# Patient Record
Sex: Male | Born: 1985 | Race: White | Hispanic: No | Marital: Married | Smoking: Never smoker
Health system: Southern US, Community
[De-identification: ages and names within clinical notes are randomized; demographics above are authoritative.]

## PROBLEM LIST (undated history)

## (undated) DIAGNOSIS — F988 Other specified behavioral and emotional disorders with onset usually occurring in childhood and adolescence: Secondary | ICD-10-CM

## (undated) DIAGNOSIS — L649 Androgenic alopecia, unspecified: Secondary | ICD-10-CM

## (undated) DIAGNOSIS — E66811 Obesity, class 1: Secondary | ICD-10-CM

## (undated) DIAGNOSIS — K760 Fatty (change of) liver, not elsewhere classified: Secondary | ICD-10-CM

## (undated) DIAGNOSIS — E669 Obesity, unspecified: Secondary | ICD-10-CM

## (undated) DIAGNOSIS — K219 Gastro-esophageal reflux disease without esophagitis: Secondary | ICD-10-CM

## (undated) DIAGNOSIS — Z9109 Other allergy status, other than to drugs and biological substances: Secondary | ICD-10-CM

## (undated) HISTORY — DX: Other allergy status, other than to drugs and biological substances: Z91.09

## (undated) HISTORY — DX: Fatty (change of) liver, not elsewhere classified: K76.0

## (undated) HISTORY — PX: OTHER SURGICAL HISTORY: SHX169

## (undated) HISTORY — DX: Other specified behavioral and emotional disorders with onset usually occurring in childhood and adolescence: F98.8

## (undated) HISTORY — DX: Obesity, unspecified: E66.9

## (undated) HISTORY — DX: Obesity, class 1: E66.811

## (undated) HISTORY — DX: Androgenic alopecia, unspecified: L64.9

## (undated) HISTORY — DX: Gastro-esophageal reflux disease without esophagitis: K21.9

## (undated) HISTORY — PX: VASECTOMY: SHX75

---

## 2006-12-17 ENCOUNTER — Emergency Department (HOSPITAL_COMMUNITY): Admission: EM | Admit: 2006-12-17 | Discharge: 2006-12-17 | Payer: Self-pay | Admitting: Emergency Medicine

## 2010-05-20 DIAGNOSIS — K760 Fatty (change of) liver, not elsewhere classified: Secondary | ICD-10-CM

## 2010-05-20 HISTORY — DX: Fatty (change of) liver, not elsewhere classified: K76.0

## 2010-10-01 ENCOUNTER — Ambulatory Visit: Payer: Self-pay | Admitting: Family

## 2010-10-02 ENCOUNTER — Encounter: Payer: Self-pay | Admitting: Family

## 2010-10-02 ENCOUNTER — Ambulatory Visit (INDEPENDENT_AMBULATORY_CARE_PROVIDER_SITE_OTHER): Payer: BC Managed Care – PPO | Admitting: Family

## 2010-10-02 VITALS — BP 112/66 | HR 66 | Temp 97.8°F | Resp 18 | Ht 73.5 in | Wt 236.1 lb

## 2010-10-02 DIAGNOSIS — Z0000 Encounter for general adult medical examination without abnormal findings: Secondary | ICD-10-CM

## 2010-10-02 DIAGNOSIS — J45909 Unspecified asthma, uncomplicated: Secondary | ICD-10-CM

## 2010-10-02 DIAGNOSIS — Z23 Encounter for immunization: Secondary | ICD-10-CM

## 2010-10-02 DIAGNOSIS — K219 Gastro-esophageal reflux disease without esophagitis: Secondary | ICD-10-CM

## 2010-10-02 DIAGNOSIS — J309 Allergic rhinitis, unspecified: Secondary | ICD-10-CM

## 2010-10-02 DIAGNOSIS — Z13228 Encounter for screening for other metabolic disorders: Secondary | ICD-10-CM

## 2010-10-02 NOTE — Progress Notes (Signed)
Subjective:    Patient ID: Gary Griffith, male    DOB: 1985/07/28, 25 y.o.   MRN: [id number]  HPI  [name] is a 25 yr old male who presents today to establish care.  Preventative-  Patient stopped exercising 2 months ago due to back injury. Back is improved- he has been following with a chiropractor.   Diet is fair- he has cut out french fries.  Drinks 1 soda day.  Last tetanus shot was around 2000.    Asthma- as a child, no current issues.  Hay Fever- saw an allergist last year- told that he has tree, grass, dust, horse.  He takes OTC claritin with good improvement.   GERD-  Takes otc prilosec once a day with good improvement.    Review of Systems  Constitutional: Negative for fever and unexpected weight change.  HENT: Negative for hearing loss.   Eyes: Negative for visual disturbance.  Respiratory: Negative for chest tightness and wheezing.   Cardiovascular: Negative for chest pain and leg swelling.  Gastrointestinal: Negative for diarrhea and constipation.  Genitourinary: Negative for dysuria and frequency.  Musculoskeletal: Positive for back pain. Negative for arthralgias.  Skin: Negative.   Neurological: Negative for weakness and numbness.  Hematological: Negative for adenopathy.  Psychiatric/Behavioral:       Denies hx of depression or anxiety   Past Medical History  Diagnosis Date  . Allergy     environmental--trees, grass, dust  . Asthma   . History of chicken pox   . GERD (gastroesophageal reflux disease)     History   Social History  . Marital Status: Married    Spouse Name: N/A    Number of Children: 0  . Years of Education: N/A   Occupational History  . Not on file.   Social History Main Topics  . Smoking status: Never Smoker   . Smokeless tobacco: Never Used  . Alcohol Use: 0.6 oz/week    1 Cans of beer per week  . Drug Use: No  . Sexually Active: Not on file   Other Topics Concern  . Not on file   Social History Narrative   Regular  exercise: not currently, plans to restart gym 3 x weeklyCaffeine Use: 1 soda daily.    Past Surgical History  Procedure Date  . No surgery     Family History  Problem Relation Age of Onset  . Hypertension Father   . Diabetes Maternal Grandmother   . Heart disease Maternal Grandmother     MI, stents  . Diabetes Paternal Grandfather   . Cancer Neg Hx     No Known Allergies  No current outpatient prescriptions on file prior to visit.    BP 112/66  Pulse 66  Temp(Src) 97.8 F (36.6 C) (Oral)  Resp 18  Ht 6' 1.5" (1.867 m)  Wt 236 lb 1.3 oz (107.085 kg)  BMI 30.72 kg/m2        Objective:   Physical Exam  Constitutional: He is oriented to person, place, and time. He appears well-developed and well-nourished.  HENT:  Head: Normocephalic and atraumatic.  Right Ear: Tympanic membrane and ear canal normal.  Left Ear: Tympanic membrane and ear canal normal.  Mouth/Throat: Uvula is midline, oropharynx is clear and moist and mucous membranes are normal.  Eyes: Conjunctivae and EOM are normal. Pupils are equal, round, and reactive to light.  Neck: Normal range of motion. Neck supple. No JVD present. No thyromegaly present.  Cardiovascular: Normal rate and  regular rhythm.   Pulmonary/Chest: Effort normal and breath sounds normal.  Abdominal: Soft. Bowel sounds are normal. He exhibits no distension. There is no tenderness.  Musculoskeletal: Normal range of motion. He exhibits no edema and no tenderness.  Lymphadenopathy:    He has no cervical adenopathy.  Neurological: He is alert and oriented to person, place, and time.  Skin: Skin is warm and dry. No rash noted. No erythema.  Psychiatric: He has a normal mood and affect. His behavior is normal. Judgment and thought content normal.          Assessment & Plan:

## 2010-10-02 NOTE — Patient Instructions (Addendum)
Please complete your cystic fibrosis screening lab work on the first floor.  Return to the lab fasting for your physical lab work.

## 2010-10-03 DIAGNOSIS — J45909 Unspecified asthma, uncomplicated: Secondary | ICD-10-CM | POA: Insufficient documentation

## 2010-10-03 DIAGNOSIS — K219 Gastro-esophageal reflux disease without esophagitis: Secondary | ICD-10-CM | POA: Insufficient documentation

## 2010-10-03 DIAGNOSIS — J309 Allergic rhinitis, unspecified: Secondary | ICD-10-CM | POA: Insufficient documentation

## 2010-10-03 LAB — CBC WITH DIFFERENTIAL/PLATELET
Basophils Absolute: 0 10*3/uL (ref 0.0–0.1)
Basophils Relative: 0 % (ref 0–1)
Eosinophils Absolute: 0.2 10*3/uL (ref 0.0–0.7)
Hemoglobin: 17 g/dL (ref 13.0–17.0)
MCH: 29.7 pg (ref 26.0–34.0)
MCHC: 36.1 g/dL — ABNORMAL HIGH (ref 30.0–36.0)
Monocytes Absolute: 0.4 10*3/uL (ref 0.1–1.0)
Monocytes Relative: 7 % (ref 3–12)
Neutrophils Relative %: 60 % (ref 43–77)
RDW: 13 % (ref 11.5–15.5)

## 2010-10-03 LAB — HEPATIC FUNCTION PANEL
ALT: 79 U/L — ABNORMAL HIGH (ref 0–53)
Bilirubin, Direct: 0.1 mg/dL (ref 0.0–0.3)
Indirect Bilirubin: 0.4 mg/dL (ref 0.0–0.9)
Total Bilirubin: 0.5 mg/dL (ref 0.3–1.2)

## 2010-10-03 LAB — LIPID PANEL
Cholesterol: 166 mg/dL (ref 0–200)
HDL: 35 mg/dL — ABNORMAL LOW (ref >39)
Triglycerides: 161 mg/dL — ABNORMAL HIGH (ref <150)
VLDL: 32 mg/dL (ref 0–40)

## 2010-10-03 NOTE — Assessment & Plan Note (Signed)
Pt counseled on diet, exercise and weight loss.  Due for tetanus which was given today.

## 2010-10-03 NOTE — Assessment & Plan Note (Signed)
Wife has cystic fibrosis trait.  He wishes to be tested as part of pre-conception planning.  Will order.

## 2010-10-03 NOTE — Assessment & Plan Note (Signed)
Stable, continue loratidine.

## 2010-10-03 NOTE — Assessment & Plan Note (Signed)
Stable, continue PPI

## 2010-10-04 LAB — BASIC METABOLIC PANEL WITH GFR
BUN: 16 mg/dL (ref 6–23)
Calcium: 10.5 mg/dL (ref 8.4–10.5)
GFR, Est African American: >60 mL/min (ref >60)
Glucose, Bld: 93 mg/dL (ref 70–99)
Sodium: 141 mEq/L (ref 135–145)

## 2010-10-08 ENCOUNTER — Telehealth: Payer: Self-pay | Admitting: *Deleted

## 2010-10-08 DIAGNOSIS — R748 Abnormal levels of other serum enzymes: Secondary | ICD-10-CM

## 2010-10-08 NOTE — Telephone Encounter (Signed)
Received message from pt requesting status of Cystic Fibrosis screen. Per Annice Pih at [facility], test has a 5 day turn around time. Today is the 5th day, we should receive result by tomorrow. Pt has been notified of status. Will call lab tomorrow if result is not final.

## 2010-10-09 LAB — CYSTIC FIBROSIS DIAGNOSTIC STUDY

## 2010-10-09 NOTE — Telephone Encounter (Signed)
Spoke to [place] at [facility], he states he will email the Outside lab to check on the status of Cystic Fibrosis Screen as it is still pending in their system.

## 2010-10-10 NOTE — Telephone Encounter (Signed)
Pt notified, see result note. Orders entered for future labs and forwarded to the lab. Cystic Fibrosis result faxed to [facility] @ [phone].  All results mailed to pt.

## 2010-10-12 ENCOUNTER — Telehealth: Payer: Self-pay | Admitting: *Deleted

## 2010-10-12 NOTE — Telephone Encounter (Signed)
Received records from Dr Jonny Ruiz Mitchell's office and forwarded to Provider for review.

## 2010-10-22 ENCOUNTER — Encounter: Payer: Self-pay | Admitting: Family

## 2010-10-22 DIAGNOSIS — F988 Other specified behavioral and emotional disorders with onset usually occurring in childhood and adolescence: Secondary | ICD-10-CM | POA: Insufficient documentation

## 2010-10-22 HISTORY — DX: Other specified behavioral and emotional disorders with onset usually occurring in childhood and adolescence: F98.8

## 2010-10-30 LAB — HEPATITIS PANEL, ACUTE: Hepatitis B Surface Ag: NEGATIVE

## 2010-10-30 LAB — HEPATIC FUNCTION PANEL
ALT: 103 U/L — ABNORMAL HIGH (ref 0–53)
AST: 44 U/L — ABNORMAL HIGH (ref 0–37)
Albumin: 4.8 g/dL (ref 3.5–5.2)

## 2010-10-31 ENCOUNTER — Telehealth: Payer: Self-pay | Admitting: Family

## 2010-10-31 DIAGNOSIS — R7989 Other specified abnormal findings of blood chemistry: Secondary | ICD-10-CM

## 2010-10-31 NOTE — Telephone Encounter (Signed)
Call placed to patient at [phone], he was advised per Sandford Craze instructions. He was advised to call back if he has not heard from the office in 3-5 days regarding his ultrasound. Patient has verbalized understanding and agrees as instructed

## 2010-10-31 NOTE — Telephone Encounter (Signed)
Please call patient and let him know that his liver tests remain elevated.  His hepatitis screening is negative.   I would like for him to complete an ultrasound of his liver.

## 2010-11-02 ENCOUNTER — Ambulatory Visit (HOSPITAL_BASED_OUTPATIENT_CLINIC_OR_DEPARTMENT_OTHER)
Admission: RE | Admit: 2010-11-02 | Discharge: 2010-11-02 | Disposition: A | Discharge status: Home / Self Care | Payer: BC Managed Care – PPO | Source: Ambulatory Visit | Attending: Family | Admitting: Family

## 2010-11-02 DIAGNOSIS — R945 Abnormal results of liver function studies: Secondary | ICD-10-CM | POA: Insufficient documentation

## 2010-11-02 DIAGNOSIS — R7989 Other specified abnormal findings of blood chemistry: Secondary | ICD-10-CM

## 2010-11-04 ENCOUNTER — Telehealth: Payer: Self-pay | Admitting: Family

## 2010-11-04 DIAGNOSIS — K760 Fatty (change of) liver, not elsewhere classified: Secondary | ICD-10-CM

## 2010-11-04 DIAGNOSIS — R7989 Other specified abnormal findings of blood chemistry: Secondary | ICD-10-CM

## 2010-11-04 NOTE — Telephone Encounter (Signed)
Pls call patient and let him know that his ultrasound shows a fatty liver.  I recommend that he work hard on a low fat/low cholesterol diet, exercise and weight loss.

## 2010-11-05 ENCOUNTER — Telehealth: Payer: Self-pay | Admitting: Family

## 2010-11-05 NOTE — Telephone Encounter (Signed)
Pt would like results from ultra sound.

## 2010-11-05 NOTE — Telephone Encounter (Signed)
Pt.notified

## 2010-11-06 ENCOUNTER — Telehealth: Payer: Self-pay | Admitting: *Deleted

## 2010-11-06 NOTE — Telephone Encounter (Signed)
Received message from pt requesting a referral to a nutritionist as we recommended that he follow a low fat/ low cholesterol diet. Please advise.

## 2010-11-07 ENCOUNTER — Telehealth: Payer: Self-pay | Admitting: Family

## 2010-11-07 DIAGNOSIS — K760 Fatty (change of) liver, not elsewhere classified: Secondary | ICD-10-CM

## 2010-11-07 NOTE — Telephone Encounter (Signed)
Pt called back requesting status of nutritionist recommendation and wants to know if he still needs to keep appt with [place] on 11/09/10 as we have already notified him of labs and u/s. Please advise.

## 2010-11-07 NOTE — Telephone Encounter (Signed)
I have placed nutrition referral.  He can reschedule his follow up for 3 months.

## 2010-11-08 NOTE — Telephone Encounter (Signed)
Left detailed message on machine re: instructions below and to call and reschedule follow up for the end of September.

## 2010-11-09 ENCOUNTER — Ambulatory Visit: Payer: BC Managed Care – PPO | Admitting: Family

## 2010-12-19 ENCOUNTER — Encounter: Payer: Self-pay | Admitting: Family Medicine

## 2010-12-19 ENCOUNTER — Ambulatory Visit (INDEPENDENT_AMBULATORY_CARE_PROVIDER_SITE_OTHER): Payer: BC Managed Care – PPO | Admitting: Family Medicine

## 2010-12-19 VITALS — BP 145/83 | HR 62 | Temp 98.0°F | Ht 73.5 in | Wt 239.4 lb

## 2010-12-19 DIAGNOSIS — H6092 Unspecified otitis externa, left ear: Secondary | ICD-10-CM

## 2010-12-19 DIAGNOSIS — H60399 Other infective otitis externa, unspecified ear: Secondary | ICD-10-CM

## 2010-12-19 DIAGNOSIS — H6090 Unspecified otitis externa, unspecified ear: Secondary | ICD-10-CM

## 2010-12-19 MED ORDER — CEFDINIR 300 MG PO CAPS: 300.0000 mg | ORAL_CAPSULE | Freq: Two times a day (BID) | ORAL | Status: AC

## 2010-12-19 MED ORDER — ALIGN PO CAPS: 1.0000 | ORAL_CAPSULE | Freq: Every day | ORAL | Status: AC

## 2010-12-19 NOTE — Assessment & Plan Note (Signed)
Patient is in today with 24 hour history of worsening left ear pain. He had a work physical yesterday where they noted a significant amount of wax in his ear popped and then overnight began to have pain in front of them behind the ear stiffness and some discomfort in the neck and some low-grade headache myalgias and fatigue and is here today for evaluation. He is on have an otitis externa on examination due to his systemic symptoms we will start him on Ceftin air 3 mg twice a day and he is asked to take a probiotic and a yogurt daily as well report if symptoms do not improve. If he begins to have recurrent trouble with swimmer's ear he may try swimmer's ear drops after getting out of school routinely

## 2010-12-19 NOTE — Progress Notes (Signed)
Gary Griffith [id number] Jun 20, 1985 12/19/2010      Progress Note-Follow Up  Subjective  Chief Complaint  Chief Complaint  Patient presents with  . Otitis Media    left ear X 1 day    HPI  Patient is a 25 year old Caucasian male who is in today with a 24-hour history of left ear pain. He actually had a work physical yesterday where they reported seeing a significant amount of wax in his ear then over the past 24 hours she's developed some pain around his ear. He's developed some muffled hearing as well. Some painful lymph nodes also developed as well as malaise, myalgias. No fevers, chills, cp, palp, sob. Has ongoing allergic symptoms and mild congestion but that is relatively well controlled on Loratadine.  Past Medical History  Diagnosis Date  . Allergy     environmental--trees, grass, dust  . Asthma   . History of chicken pox   . GERD (gastroesophageal reflux disease)   . ADD (attention deficit disorder) 10/22/2010  . Otitis externa of left ear 12/19/2010    Past Surgical History  Procedure Date  . No surgery     Family History  Problem Relation Age of Onset  . Hypertension Father   . Diabetes Maternal Grandmother   . Heart disease Maternal Grandmother     MI, stents  . Diabetes Paternal Grandfather   . Cancer Neg Hx     History   Social History  . Marital Status: Married    Spouse Name: N/A    Number of Children: 0  . Years of Education: N/A   Occupational History  . Not on file.   Social History Main Topics  . Smoking status: Never Smoker   . Smokeless tobacco: Never Used  . Alcohol Use: 0.6 oz/week    1 Cans of beer per week     occasionally  . Drug Use: No  . Sexually Active: Yes -- Male partner(s)   Other Topics Concern  . Not on file   Social History Narrative   Regular exercise: not currently, plans to restart gym 3 x weeklyCaffeine Use: 1 soda daily.    Current Outpatient Prescriptions on File Prior to Visit  Medication Sig Dispense  Refill  . loratadine (ALLERGY) 10 MG tablet Take 10 mg by mouth daily as needed.       Marland Kitchen omeprazole (PRILOSEC) 20 MG capsule Take 20 mg by mouth every morning.          No Known Allergies  Review of Systems  Review of Systems  Constitutional: Positive for malaise/fatigue. Negative for fever.  HENT: Positive for ear pain. Negative for hearing loss, nosebleeds, congestion, sore throat and tinnitus.        Left ear pain and mildly muffled hearing x 24 hours  Eyes: Negative for discharge.  Respiratory: Negative for shortness of breath.   Cardiovascular: Negative for chest pain, palpitations and leg swelling.  Gastrointestinal: Negative for nausea, abdominal pain and diarrhea.  Genitourinary: Negative for dysuria.  Musculoskeletal: Positive for myalgias. Negative for falls.  Skin: Negative for rash.  Neurological: Negative for loss of consciousness and headaches.  Endo/Heme/Allergies: Negative for polydipsia.  Psychiatric/Behavioral: Negative for depression and suicidal ideas. The patient is not nervous/anxious and does not have insomnia.     Objective  BP 145/83  Pulse 62  Temp(Src) 98 F (36.7 C) (Oral)  Ht 6' 1.5" (1.867 m)  Wt 239 lb 6.4 oz (108.591 kg)  BMI 31.16 kg/m2  SpO2 97%  Physical Exam  Physical Exam  Constitutional: He is oriented to person, place, and time and well-developed, well-nourished, and in no distress. No distress.  HENT:  Head: Normocephalic and atraumatic.  Right Ear: External ear normal.  Nose: Nose normal.       Left ear has posterior and anterior auricular lymphadenopathy mildly tender, external canal is boggy, erythematous and has serous drainage, TM is largely obscured by inflammation but is dull and erythematous  Eyes: Conjunctivae are normal. Right eye exhibits no discharge. Left eye exhibits no discharge.  Neck: Neck supple. No thyromegaly present.  Cardiovascular: Normal rate, regular rhythm and normal heart sounds.   No murmur  heard. Pulmonary/Chest: Effort normal and breath sounds normal. No respiratory distress.  Abdominal: He exhibits no distension and no mass. There is no tenderness.  Musculoskeletal: He exhibits no edema.  Neurological: He is alert and oriented to person, place, and time.  Skin: Skin is warm.  Psychiatric: Memory, affect and judgment normal.    Lab Results  Component Value Date   TSH 1.957 10/02/2010   Lab Results  Component Value Date   WBC 6.0 10/02/2010   HGB 17.0 10/02/2010   HCT 47.1 10/02/2010   MCV 82.3 10/02/2010   PLT 181 10/02/2010   Lab Results  Component Value Date   CREATININE 0.91 10/02/2010   BUN 16 10/02/2010   NA 141 10/02/2010   K 4.3 10/02/2010   CL 104 10/02/2010   CO2 27 10/02/2010   Lab Results  Component Value Date   ALT 103* 10/29/2010   AST 44* 10/29/2010   ALKPHOS 88 10/29/2010   BILITOT 0.6 10/29/2010   Lab Results  Component Value Date   CHOL 166 10/02/2010   Lab Results  Component Value Date   HDL 35* 10/02/2010   Lab Results  Component Value Date   LDLCALC 99 10/02/2010   Lab Results  Component Value Date   TRIG 161* 10/02/2010   Lab Results  Component Value Date   CHOLHDL 4.7 10/02/2010     Assessment & Plan  Otitis externa of left ear Patient is in today with 24 hour history of worsening left ear pain. He had a work physical yesterday where they noted a significant amount of wax in his ear popped and then overnight began to have pain in front of them behind the ear stiffness and some discomfort in the neck and some low-grade headache myalgias and fatigue and is here today for evaluation. He is on have an otitis externa on examination due to his systemic symptoms we will start him on Ceftin air 3 mg twice a day and he is asked to take a probiotic and a yogurt daily as well report if symptoms do not improve. If he begins to have recurrent trouble with swimmer's ear he may try swimmer's ear drops after getting out of school routinely

## 2010-12-19 NOTE — Patient Instructions (Signed)
Swimmer's Ear (Otitis Externa) Otitis externa ("swimmer's ear") is a germ (bacterial) or fungal infection of the outer ear canal (from the eardrum to the outside of the ear). Swimming in dirty water may cause swimmer's ear. It also may be caused by moisture in the ear from water remaining after swimming or bathing. Often the first signs of infection may be itching in the ear canal. This may progress to ear canal swelling, redness, and pus drainage which may be signs of infection. HOME CARE INSTRUCTIONS  Apply the antibiotic drops to the ear canal as prescribed by your doctor.   This can be a very painful medical condition. A strong pain reliever may be prescribed.   Only take over-the-counter or prescription medicines for pain, discomfort, or fever as directed by your caregiver.   If your caregiver has given you a follow-up appointment, it is very important to keep that appointment. Not keeping the appointment could result in a chronic or permanent injury, pain, hearing loss and disability. If there is any problem keeping the appointment, you must call back to this facility for assistance.  PREVENTION  It is important to keep your ear dry. Use the corner of a towel to wick water out of the ear canal after swimming or bathing.   Avoid scratching in your ear. This can damage the ear canal or remove the protective wax lining the canal and make it easier for germs (bacteria) or a fungus to grow.   You may use ear drops made of rubbing alcohol and vinegar after swimming to prevent future "swimmer ear" infections. Make up a small bottle of equal parts white vinegar and alcohol. Put 3 or 4 drops into each ear after swimming.   Avoid swimming in lakes, polluted water, or poorly chlorinated pools.  SEEK MEDICAL CARE IF:  An oral temperature above 104 develops.   Your ear is still painful after 3 days and shows signs of getting worse (redness, swelling, pain, or pus).  MAKE SURE YOU:   Understand  these instructions.   Will watch your condition.   Will get help right away if you are not doing well or get worse.  Document Released: 05/06/2005 Document Re-Released: 04/18/2008 [facility] Patient Information 2011 [place], [place].

## 2011-03-04 LAB — CARBOXYHEMOGLOBIN: Methemoglobin: 0.4

## 2012-06-23 ENCOUNTER — Encounter: Payer: Self-pay | Admitting: Family

## 2012-06-23 ENCOUNTER — Ambulatory Visit (INDEPENDENT_AMBULATORY_CARE_PROVIDER_SITE_OTHER): Payer: BC Managed Care – PPO | Admitting: Family

## 2012-06-23 VITALS — BP 144/56 | HR 77 | Temp 98.1°F | Resp 16

## 2012-06-23 DIAGNOSIS — H6692 Otitis media, unspecified, left ear: Secondary | ICD-10-CM

## 2012-06-23 DIAGNOSIS — H6690 Otitis media, unspecified, unspecified ear: Secondary | ICD-10-CM

## 2012-06-23 DIAGNOSIS — R059 Cough, unspecified: Secondary | ICD-10-CM

## 2012-06-23 DIAGNOSIS — R05 Cough: Secondary | ICD-10-CM

## 2012-06-23 LAB — POCT INFLUENZA A/B
Influenza A, POC: NEGATIVE
Influenza B, POC: NEGATIVE

## 2012-06-23 MED ORDER — AMOXICILLIN-POT CLAVULANATE 875-125 MG PO TABS: 1.0000 | ORAL_TABLET | Freq: Two times a day (BID) | ORAL | Status: DC

## 2012-06-23 NOTE — Assessment & Plan Note (Addendum)
Will Rx with Augmentin.  Rapid flu is negative.

## 2012-06-23 NOTE — Addendum Note (Signed)
Addended by: Mervin Kung A on: 06/23/2012 04:37 PM   Modules accepted: Orders

## 2012-06-23 NOTE — Progress Notes (Signed)
  Subjective:    Patient ID: Gary Griffith, male    DOB: [birth year], 27 y.o.   MRN: [id number]  HPI  Gary Griffith is a 27 yr old male who presents today with chief complaint of nasal congestion.  He reports that symptoms are associated with sinus pressure.  He has been using otc nasal spray without improvement. Also using Nyquil/Dayquil.    Notes + sick contact at work. + aching.  He did not have a flu shot this season. Denies fever.      Review of Systems See HPI  Past Medical History  Diagnosis Date  . Allergy     environmental--trees, grass, dust  . Asthma   . History of chicken pox   . GERD (gastroesophageal reflux disease)   . ADD (attention deficit disorder) 10/22/2010  . Otitis externa of left ear 12/19/2010    History   Social History  . Marital Status: Married    Spouse Name: N/A    Number of Children: 0  . Years of Education: N/A   Occupational History  . Not on file.   Social History Main Topics  . Smoking status: Never Smoker   . Smokeless tobacco: Never Used  . Alcohol Use: 0.6 oz/week    1 Cans of beer per week     Comment: occasionally  . Drug Use: No  . Sexually Active: Yes -- Male partner(s)   Other Topics Concern  . Not on file   Social History Narrative   Regular exercise: not currently, plans to restart gym 3 x weeklyCaffeine Use: 1 soda daily.    Past Surgical History  Procedure Date  . No surgery     Family History  Problem Relation Age of Onset  . Hypertension Father   . Diabetes Maternal Grandmother   . Heart disease Maternal Grandmother     MI, stents  . Diabetes Paternal Grandfather   . Cancer Neg Hx     No Known Allergies  Current Outpatient Prescriptions on File Prior to Visit  Medication Sig Dispense Refill  . loratadine (ALLERGY) 10 MG tablet Take 10 mg by mouth daily as needed.       Marland Kitchen omeprazole (PRILOSEC) 20 MG capsule Take 20 mg by mouth every morning.          BP 144/56  Pulse 77  Temp 98.1 F (36.7 C) (Oral)   Resp 16  SpO2 99%       Objective:   Physical Exam  Constitutional: He appears well-developed and well-nourished. No distress.  HENT:  Head: Normocephalic and atraumatic.       L TM is pink. No bulging. R TM is normal  Mild post oropharyngeal erythema, no exudates.   Cardiovascular: Normal rate and regular rhythm.   No murmur heard. Pulmonary/Chest: Effort normal and breath sounds normal. No respiratory distress. He has no wheezes. He has no rales. He exhibits no tenderness.  Lymphadenopathy:    He has cervical adenopathy.          Assessment & Plan:

## 2012-07-22 ENCOUNTER — Ambulatory Visit (INDEPENDENT_AMBULATORY_CARE_PROVIDER_SITE_OTHER): Payer: BC Managed Care – PPO | Admitting: Family

## 2012-07-22 ENCOUNTER — Encounter: Payer: Self-pay | Admitting: Family

## 2012-07-22 VITALS — BP 136/70 | HR 60 | Temp 98.1°F | Resp 16 | Wt 229.0 lb

## 2012-07-22 DIAGNOSIS — L237 Allergic contact dermatitis due to plants, except food: Secondary | ICD-10-CM

## 2012-07-22 DIAGNOSIS — L255 Unspecified contact dermatitis due to plants, except food: Secondary | ICD-10-CM

## 2012-07-22 MED ORDER — PREDNISONE 10 MG PO TABS: ORAL_TABLET | ORAL | Status: DC

## 2012-07-22 NOTE — Progress Notes (Signed)
  Subjective:    Patient ID: Gary Griffith, male    DOB: 24-Feb-1986, 27 y.o.   MRN: [id number]  HPI  Gary Griffith is a 27 yr old male who presents today with chief complaint of rash.  Rash is located on his forearms and beneath his left eye.  Rash is pruritic.  He did some yard work over the weekend.  Review of Systems    see HPI  Objective:   Physical Exam  Constitutional: He appears well-developed and well-nourished. No distress.  Skin: Skin is warm and dry.  Erythematous papular blisters noted on bilateral forearms.  Small patch of erythema beneath the left eyelid.          Assessment & Plan:

## 2012-07-22 NOTE — Assessment & Plan Note (Signed)
Will rx with prednisone taper. Recommended benadryl HS prn itching.

## 2012-07-22 NOTE — Patient Instructions (Addendum)
Poison Ivy  Poison ivy is a inflammation of the skin (contact dermatitis) caused by touching the allergens on the leaves of the ivy plant following previous exposure to the plant. The rash usually appears 48 hours after exposure. The rash is usually bumps (papules) or blisters (vesicles) in a linear pattern. Depending on your own sensitivity, the rash may simply cause redness and itching, or it may also progress to blisters which may break open. These must be well cared for to prevent secondary bacterial (germ) infection, followed by scarring. Keep any open areas dry, clean, dressed, and covered with an antibacterial ointment if needed. The eyes may also get puffy. The puffiness is worst in the morning and gets better as the day progresses. This dermatitis usually heals without scarring, within 2 to 3 weeks without treatment.  HOME CARE INSTRUCTIONS   Thoroughly wash with soap and water as soon as you have been exposed to poison ivy. You have about one half hour to remove the plant resin before it will cause the rash. This washing will destroy the oil or antigen on the skin that is causing, or will cause, the rash. Be sure to wash under your fingernails as any plant resin there will continue to spread the rash. Do not rub skin vigorously when washing affected area. Poison ivy cannot spread if no oil from the plant remains on your body. A rash that has progressed to weeping sores will not spread the rash unless you have not washed thoroughly. It is also important to wash any clothes you have been wearing as these may carry active allergens. The rash will return if you wear the unwashed clothing, even several days later.  Avoidance of the plant in the future is the best measure. Poison ivy plant can be recognized by the number of leaves. Generally, poison ivy has three leaves with flowering branches on a single stem.  Diphenhydramine may be purchased over the counter and used as needed for itching. Do not drive with  this medication if it makes you drowsy.Ask your caregiver about medication for children.  SEEK MEDICAL CARE IF:   Open sores develop.   Redness spreads beyond area of rash.   You notice purulent (pus-like) discharge.   You have increased pain.   Other signs of infection develop (such as fever).  Document Released: 05/03/2000 Document Revised: 07/29/2011 Document Reviewed: 03/22/2009  [employer] Patient Information 2013 [employer].

## 2012-08-24 ENCOUNTER — Telehealth: Payer: Self-pay | Admitting: *Deleted

## 2012-08-24 NOTE — Telephone Encounter (Signed)
Notified pt and he voices understanding. 

## 2012-08-24 NOTE — Telephone Encounter (Signed)
I recommend calamine lotion, hydrocoritisone cream and benadryl as needed.  If these meds are not helping or if rash worsens needs office visit.

## 2012-08-24 NOTE — Telephone Encounter (Signed)
Received message from pt stating he did yard work this weekend and now has rash on his face and neck. Pt thinks it is poison oak and requests Rx be sent to the pharmacy  Please advise.

## 2012-08-25 ENCOUNTER — Encounter: Payer: Self-pay | Admitting: Family

## 2012-08-25 ENCOUNTER — Ambulatory Visit (INDEPENDENT_AMBULATORY_CARE_PROVIDER_SITE_OTHER): Payer: BC Managed Care – PPO | Admitting: Family

## 2012-08-25 VITALS — BP 120/60 | HR 64 | Temp 97.8°F | Resp 16 | Wt 230.1 lb

## 2012-08-25 DIAGNOSIS — L255 Unspecified contact dermatitis due to plants, except food: Secondary | ICD-10-CM

## 2012-08-25 DIAGNOSIS — L237 Allergic contact dermatitis due to plants, except food: Secondary | ICD-10-CM

## 2012-08-25 MED ORDER — METHYLPREDNISOLONE SODIUM SUCC 125 MG IJ SOLR
125.0000 mg | Freq: Once | INTRAMUSCULAR | Status: AC
  Administered 2012-08-25: 125 mg via INTRAMUSCULAR

## 2012-08-25 MED ORDER — METHYLPREDNISOLONE 4 MG PO KIT: PACK | ORAL | Status: DC

## 2012-08-25 NOTE — Assessment & Plan Note (Signed)
Rx with solumedrol IM and medrol dose pak.

## 2012-08-25 NOTE — Addendum Note (Signed)
Addended by: Mervin Kung A on: 08/25/2012 09:16 AM   Modules accepted: Orders

## 2012-08-25 NOTE — Patient Instructions (Addendum)
Poison Ivy  Poison ivy is a inflammation of the skin (contact dermatitis) caused by touching the allergens on the leaves of the ivy plant following previous exposure to the plant. The rash usually appears 48 hours after exposure. The rash is usually bumps (papules) or blisters (vesicles) in a linear pattern. Depending on your own sensitivity, the rash may simply cause redness and itching, or it may also progress to blisters which may break open. These must be well cared for to prevent secondary bacterial (germ) infection, followed by scarring. Keep any open areas dry, clean, dressed, and covered with an antibacterial ointment if needed. The eyes may also get puffy. The puffiness is worst in the morning and gets better as the day progresses. This dermatitis usually heals without scarring, within 2 to 3 weeks without treatment.  HOME CARE INSTRUCTIONS   Thoroughly wash with soap and water as soon as you have been exposed to poison ivy. You have about one half hour to remove the plant resin before it will cause the rash. This washing will destroy the oil or antigen on the skin that is causing, or will cause, the rash. Be sure to wash under your fingernails as any plant resin there will continue to spread the rash. Do not rub skin vigorously when washing affected area. Poison ivy cannot spread if no oil from the plant remains on your body. A rash that has progressed to weeping sores will not spread the rash unless you have not washed thoroughly. It is also important to wash any clothes you have been wearing as these may carry active allergens. The rash will return if you wear the unwashed clothing, even several days later.  Avoidance of the plant in the future is the best measure. Poison ivy plant can be recognized by the number of leaves. Generally, poison ivy has three leaves with flowering branches on a single stem.  Diphenhydramine may be purchased over the counter and used as needed for itching. Do not drive with  this medication if it makes you drowsy.Ask your caregiver about medication for children.  SEEK MEDICAL CARE IF:   Open sores develop.   Redness spreads beyond area of rash.   You notice purulent (pus-like) discharge.   You have increased pain.   Other signs of infection develop (such as fever).  Document Released: 05/03/2000 Document Revised: 07/29/2011 Document Reviewed: 03/22/2009  [employer] Patient Information 2013 [employer].

## 2012-08-25 NOTE — Progress Notes (Signed)
  Subjective:    Patient ID: Gary Griffith, male    DOB: 01-20-1986, 27 y.o.   MRN: [id number]  HPI  [name] is a 27 yr old male who presents today with chief complaint of pruritic rash. Did some yard work over the weekend then developed rash on arms, torso and face.  No improvement with benadryl. Having trouble sleeping due to severe itching.  Review of Systems    see HPI   Objective:   Physical Exam  Constitutional: He is oriented to person, place, and time. He appears well-developed and well-nourished. No distress.  Neurological: He is alert and oriented to person, place, and time.  Skin: Skin is warm and dry.  Raise blistering erythematous rash noted on arms, some erythema around eyes.   Psychiatric: He has a normal mood and affect. His behavior is normal. Judgment and thought content normal.          Assessment & Plan:

## 2012-12-09 ENCOUNTER — Ambulatory Visit: Payer: BC Managed Care – PPO | Admitting: Family Medicine

## 2013-02-01 ENCOUNTER — Ambulatory Visit (INDEPENDENT_AMBULATORY_CARE_PROVIDER_SITE_OTHER): Payer: BC Managed Care – PPO | Admitting: Family

## 2013-02-01 ENCOUNTER — Encounter: Payer: Self-pay | Admitting: Family

## 2013-02-01 VITALS — BP 130/70 | HR 69 | Temp 98.0°F | Resp 16 | Ht 73.0 in | Wt 234.0 lb

## 2013-02-01 DIAGNOSIS — Z23 Encounter for immunization: Secondary | ICD-10-CM

## 2013-02-01 DIAGNOSIS — Z0000 Encounter for general adult medical examination without abnormal findings: Secondary | ICD-10-CM

## 2013-02-01 DIAGNOSIS — N529 Male erectile dysfunction, unspecified: Secondary | ICD-10-CM

## 2013-02-01 MED ORDER — SILDENAFIL CITRATE 50 MG PO TABS: 50.0000 mg | ORAL_TABLET | Freq: Every day | ORAL | Status: DC | PRN

## 2013-02-01 NOTE — Patient Instructions (Addendum)
Work hard on [employer], exercise, weight loss. Call if you do not note improvement in ED symptoms with viagra- you can start by using a 1/2 tablet and go up to a full tablet as needed. Follow up in 6 months.

## 2013-02-01 NOTE — Assessment & Plan Note (Signed)
New. Trial of viagra.  

## 2013-02-01 NOTE — Progress Notes (Signed)
Subjective:    Patient ID: Gary Griffith, male    DOB: 03/27/1986, 27 y.o.   MRN: [id number]  HPI  Gary Griffith is a 27 yr old male who presents today for cpx.   Immunizations: would like flu shot Diet: fair diet.  Drinks about 2-3 sodas a day. Eats "whatever I want."  Exercise: not exercising, limited by child care as he has his toddler with him in the early mornings and at night.   Erectile dysfunction- pt notes that he has been having some issues with ED.  This happens about half the time.  Sometimes he cannot have an erection, other times he has difficulty sustaining erection.     Review of Systems  Constitutional: Negative for unexpected weight change.  HENT: Negative for hearing loss and congestion.   Eyes: Negative for visual disturbance.  Respiratory: Negative for cough.   Cardiovascular: Negative for chest pain.  Gastrointestinal: Negative for nausea and vomiting.  Genitourinary: Negative for dysuria and frequency.  Musculoskeletal: Negative for myalgias and arthralgias.  Skin: Negative for rash.  Neurological: Negative for headaches.  Hematological: Negative for adenopathy.  Psychiatric/Behavioral:       Denies depression/anxiety Reports that ADD is well controlled   Past Medical History  Diagnosis Date  . Allergy     environmental--trees, grass, dust  . Asthma   . History of chicken pox   . GERD (gastroesophageal reflux disease)   . ADD (attention deficit disorder) 10/22/2010  . Otitis externa of left ear 12/19/2010    History   Social History  . Marital Status: Married    Spouse Name: N/A    Number of Children: 0  . Years of Education: N/A   Occupational History  . Not on file.   Social History Main Topics  . Smoking status: Never Smoker   . Smokeless tobacco: Never Used  . Alcohol Use: 0.6 oz/week    1 Cans of beer per week     Comment: occasionally  . Drug Use: No  . Sexual Activity: Yes    Partners: Female   Other Topics Concern  . Not on file    Social History Narrative   Regular exercise: not currently, plans to restart gym 3 x weekly   Caffeine Use: 1 soda daily.   Has 1 daughter born 09/2011- Loura Pardon   Married   Owns [employer]    Past Surgical History  Procedure Laterality Date  . No surgery      Family History  Problem Relation Age of Onset  . Hypertension Father   . Diabetes Maternal Grandmother   . Heart disease Maternal Grandmother     MI, stents  . Diabetes Paternal Grandfather   . Cancer Neg Hx     No Known Allergies  No current outpatient prescriptions on file prior to visit.   No current facility-administered medications on file prior to visit.    BP 130/70  Pulse 69  Temp(Src) 98 F (36.7 C) (Oral)  Resp 16  Ht 6\' 1"  (1.854 m)  Wt 234 lb (106.142 kg)  BMI 30.88 kg/m2  SpO2 99%       Objective:   Physical Exam  Physical Exam  Constitutional: He is oriented to person, place, and time. He appears well-developed and well-nourished. No distress.  HENT:  Head: Normocephalic and atraumatic.  Right Ear: Tympanic membrane and ear canal normal.  Left Ear: Tympanic membrane and ear canal normal.  Mouth/Throat: Oropharynx is clear and moist.  Eyes: Pupils  are equal, round, and reactive to light. No scleral icterus.  Neck: Normal range of motion. No thyromegaly present.  Cardiovascular: Normal rate and regular rhythm.   No murmur heard. Pulmonary/Chest: Effort normal and breath sounds normal. No respiratory distress. He has no wheezes. He has no rales. He exhibits no tenderness.  Abdominal: Soft. Bowel sounds are normal. He exhibits no distension and no mass. There is no tenderness. There is no rebound and no guarding.  Musculoskeletal: He exhibits no edema.  Lymphadenopathy:    He has no cervical adenopathy.  Neurological: He is alert and oriented to person, place, and time. He has normal patellar reflexes. He exhibits normal muscle tone. Coordination normal.  Skin: Skin is warm and dry.   Psychiatric: He has a normal mood and affect. His behavior is normal. Judgment and thought content normal.          Assessment & Plan:           Assessment & Plan:

## 2013-02-01 NOTE — Assessment & Plan Note (Signed)
We discussed healthy diet, exercise and weight loss.  Flu shot today.  Pt will return fasting for labs.

## 2013-02-03 LAB — HEPATIC FUNCTION PANEL
ALT: 80 U/L — ABNORMAL HIGH (ref 0–53)
AST: 44 U/L — ABNORMAL HIGH (ref 0–37)
Alkaline Phosphatase: 96 U/L (ref 39–117)
Bilirubin, Direct: 0.1 mg/dL (ref 0.0–0.3)
Indirect Bilirubin: 0.7 mg/dL (ref 0.0–0.9)

## 2013-02-03 LAB — CBC WITH DIFFERENTIAL/PLATELET
Basophils Relative: 2 % — ABNORMAL HIGH (ref 0–1)
Hemoglobin: 14.2 g/dL (ref 13.0–17.0)
MCHC: 34.8 g/dL (ref 30.0–36.0)
Monocytes Relative: 10 % (ref 3–12)
Neutro Abs: 1.7 10*3/uL (ref 1.7–7.7)
Neutrophils Relative %: 25 % — ABNORMAL LOW (ref 43–77)
RBC: 5.08 MIL/uL (ref 4.22–5.81)

## 2013-02-03 LAB — BASIC METABOLIC PANEL WITH GFR
GFR, Est African American: >89 mL/min
GFR, Est Non African American: >89 mL/min
Potassium: 4.3 mEq/L (ref 3.5–5.3)
Sodium: 136 mEq/L (ref 135–145)

## 2013-02-03 LAB — LIPID PANEL
Cholesterol: 145 mg/dL (ref 0–200)
Triglycerides: 332 mg/dL — ABNORMAL HIGH (ref <150)
VLDL: 66 mg/dL — ABNORMAL HIGH (ref 0–40)

## 2013-02-04 LAB — URINALYSIS, ROUTINE W REFLEX MICROSCOPIC
Bilirubin Urine: NEGATIVE
Glucose, UA: NEGATIVE mg/dL
Hgb urine dipstick: NEGATIVE
Ketones, ur: NEGATIVE mg/dL
Leukocytes, UA: NEGATIVE
Nitrite: NEGATIVE
Protein, ur: NEGATIVE mg/dL
Specific Gravity, Urine: 1.025 (ref 1.005–1.030)
Urobilinogen, UA: 0.2 mg/dL (ref 0.0–1.0)
pH: 7 (ref 5.0–8.0)

## 2013-02-04 LAB — URINALYSIS, MICROSCOPIC ONLY: Bacteria, UA: NONE SEEN

## 2013-02-07 ENCOUNTER — Telehealth: Payer: Self-pay | Admitting: Family

## 2013-02-07 NOTE — Telephone Encounter (Signed)
Please let pt know liver function tests remain elevated most likely due to fatty liver.  Continue to work on low fat/low cholesterol diet, exercise, weight loss.  Also, triglycerides are elevated. This can be improved by avoiding concentrated sweets and white fluffy carbs.  Also, I recommend that he add fish oil 2000mg  twice daily. Other labs look good.

## 2013-02-08 NOTE — Telephone Encounter (Signed)
Left message on home/cell/work for the pt to return my call.

## 2013-02-11 NOTE — Telephone Encounter (Signed)
Notified pt and he voices understanding. 

## 2013-03-12 ENCOUNTER — Telehealth: Payer: Self-pay | Admitting: *Deleted

## 2013-03-12 NOTE — Telephone Encounter (Signed)
Pt's spouse returned my call and was advised of previous labs. Questions answered. Spouse states that pt occasionally has a "pus-like" drainage from his Designer, multimedia will become tender to about 1-2 inches surrounding naval. Pt usually pours peroxide in his naval and applies neosporin and it clears up. She reports that pt has a history of "boils" on his thigh / groin that usually go away on their own. Advised her that pt should be seen if tenderness still persists or drainage returns. She voices understanding and will notify pt.

## 2013-03-12 NOTE — Telephone Encounter (Signed)
Received message from pt's wife requesting clarification of pt's last lab results. Also thinks pt may have an infection around his navel. Attempted to reach spouse and left message to return my call.

## 2013-06-28 ENCOUNTER — Telehealth: Payer: Self-pay | Admitting: *Deleted

## 2013-06-28 ENCOUNTER — Ambulatory Visit (INDEPENDENT_AMBULATORY_CARE_PROVIDER_SITE_OTHER): Payer: BC Managed Care – PPO | Admitting: Family

## 2013-06-28 ENCOUNTER — Encounter: Payer: Self-pay | Admitting: Family

## 2013-06-28 VITALS — BP 124/70 | HR 78 | Temp 98.1°F | Resp 16 | Ht 73.0 in | Wt 233.0 lb

## 2013-06-28 DIAGNOSIS — J069 Acute upper respiratory infection, unspecified: Secondary | ICD-10-CM | POA: Insufficient documentation

## 2013-06-28 NOTE — Telephone Encounter (Signed)
Noted  

## 2013-06-28 NOTE — Progress Notes (Signed)
Pre visit review using our clinic review tool, if applicable. No additional management support is needed unless otherwise documented below in the visit note. 

## 2013-06-28 NOTE — Assessment & Plan Note (Signed)
Suspect this is viral in nature. Encouraged fluids and rest with ibuprofen for body aches and mucinex D for congestion.

## 2013-06-28 NOTE — Progress Notes (Signed)
Subjective:    Patient ID: CARNELL BEAVERS, male    DOB: 1985/08/27, 28 y.o.   MRN: [id number]  Sore Throat  Associated symptoms include congestion. Pertinent negatives include no coughing, ear pain, headaches, shortness of breath or vomiting.   Mr. Tesfaye is a 28 year old male who presents today with a chief complaint of sore throat, sinus pressure, body aches, and fatigue since Sunday morning. Patient reports he's going out of town this week. Reports taking ibuprofen, nasal spray, and mucinex within minimal relief. Denies cough, ear pain.    Review of Systems  Constitutional: Positive for fatigue. Negative for fever and chills.  HENT: Positive for congestion, rhinorrhea, sinus pressure and sore throat. Negative for ear pain.   Eyes: Negative for itching.  Respiratory: Negative for cough and shortness of breath.   Cardiovascular: Negative for chest pain.  Gastrointestinal: Positive for nausea. Negative for vomiting.  Musculoskeletal: Positive for arthralgias.  Neurological: Negative for dizziness and headaches.   Past Medical History  Diagnosis Date  . Allergy     environmental--trees, grass, dust  . Asthma   . History of chicken pox   . GERD (gastroesophageal reflux disease)   . ADD (attention deficit disorder) 10/22/2010  . Otitis externa of left ear 12/19/2010    History   Social History  . Marital Status: Married    Spouse Name: N/A    Number of Children: 0  . Years of Education: N/A   Occupational History  . Not on file.   Social History Main Topics  . Smoking status: Never Smoker   . Smokeless tobacco: Never Used  . Alcohol Use: 0.6 oz/week    1 Cans of beer per week     Comment: occasionally  . Drug Use: No  . Sexual Activity: Yes    Partners: Female   Other Topics Concern  . Not on file   Social History Narrative   Regular exercise: not currently, plans to restart gym 3 x weekly   Caffeine Use: 1 soda daily.   Has 1 daughter born 09/2011- Loura Pardon   Married     Owns [employer]    Past Surgical History  Procedure Laterality Date  . No surgery      Family History  Problem Relation Age of Onset  . Hypertension Father   . Diabetes Maternal Grandmother   . Heart disease Maternal Grandmother     MI, stents  . Diabetes Paternal Grandfather   . Cancer Neg Hx     No Known Allergies  Current Outpatient Prescriptions on File Prior to Visit  Medication Sig Dispense Refill  . loratadine (CLARITIN) 10 MG tablet Take 10 mg by mouth daily.      Marland Kitchen omeprazole (PRILOSEC) 20 MG capsule Take 20 mg by mouth daily.      . fish oil-omega-3 fatty acids 1000 MG capsule Take 2 g by mouth 2 (two) times daily.      . sildenafil (VIAGRA) 50 MG tablet Take 1 tablet (50 mg total) by mouth daily as needed for erectile dysfunction.  10 tablet  3   No current facility-administered medications on file prior to visit.    BP 124/70  Pulse 78  Temp(Src) 98.1 F (36.7 C) (Oral)  Resp 16  Ht 6\' 1"  (1.854 m)  Wt 233 lb (105.688 kg)  BMI 30.75 kg/m2  SpO2 95%       Objective:   Physical Exam  Constitutional: He is oriented to person, place, and  time. He appears well-nourished.  HENT:  Head: Normocephalic.  Right Ear: External ear normal.  Nose: Nose normal.  Mouth/Throat: Oropharynx is clear and moist.  Eyes: Pupils are equal, round, and reactive to light.  Neck: Neck supple.  Cardiovascular: Normal rate and regular rhythm.   Pulmonary/Chest: Breath sounds normal. No respiratory distress.  Lymphadenopathy:    He has no cervical adenopathy.  Neurological: He is alert and oriented to person, place, and time.  Skin: Skin is warm and dry.  Psychiatric: He has a normal mood and affect.          Assessment & Plan:

## 2013-06-28 NOTE — Telephone Encounter (Signed)
Received call from [facility] stating they do no carry mucinex d in otc form but they can provide pt with an rx if ok with Provider. Gave verbal auth to dispense 1 packet, #18 tabs with directions of 1 every 12 hours as needed.

## 2013-06-28 NOTE — Patient Instructions (Addendum)
Take ibuprofen as needed for body aches and Mucinex D for congestion. Call if fever over 101 degrees.  Upper Respiratory Infection, Adult An upper respiratory infection (URI) is also sometimes known as the common cold. The upper respiratory tract includes the nose, sinuses, throat, trachea, and bronchi. Bronchi are the airways leading to the lungs. Most people improve within 1 week, but symptoms can last up to 2 weeks. A residual cough may last even longer.  CAUSES Many different viruses can infect the tissues lining the upper respiratory tract. The tissues become irritated and inflamed and often become very moist. Mucus production is also common. A cold is contagious. You can easily spread the virus to others by oral contact. This includes kissing, sharing a glass, coughing, or sneezing. Touching your mouth or nose and then touching a surface, which is then touched by another person, can also spread the virus. SYMPTOMS  Symptoms typically develop 1 to 3 days after you come in contact with a cold virus. Symptoms vary from person to person. They may include:  Runny nose.  Sneezing.  Nasal congestion.  Sinus irritation.  Sore throat.  Loss of voice (laryngitis).  Cough.  Fatigue.  Muscle aches.  Loss of appetite.  Headache.  Low-grade fever. DIAGNOSIS  You might diagnose your own cold based on familiar symptoms, since most people get a cold 2 to 3 times a year. Your caregiver can confirm this based on your exam. Most importantly, your caregiver can check that your symptoms are not due to another disease such as strep throat, sinusitis, pneumonia, asthma, or epiglottitis. Blood tests, throat tests, and X-rays are not necessary to diagnose a common cold, but they may sometimes be helpful in excluding other more serious diseases. Your caregiver will decide if any further tests are required. RISKS AND COMPLICATIONS  You may be at risk for a more severe case of the common cold if you  smoke cigarettes, have chronic heart disease (such as heart failure) or lung disease (such as asthma), or if you have a weakened immune system. The very young and very old are also at risk for more serious infections. Bacterial sinusitis, middle ear infections, and bacterial pneumonia can complicate the common cold. The common cold can worsen asthma and chronic obstructive pulmonary disease (COPD). Sometimes, these complications can require emergency medical care and may be life-threatening. PREVENTION  The best way to protect against getting a cold is to practice good hygiene. Avoid oral or hand contact with people with cold symptoms. Wash your hands often if contact occurs. There is no clear evidence that vitamin C, vitamin E, echinacea, or exercise reduces the chance of developing a cold. However, it is always recommended to get plenty of rest and practice good nutrition. TREATMENT  Treatment is directed at relieving symptoms. There is no cure. Antibiotics are not effective, because the infection is caused by a virus, not by bacteria. Treatment may include:  Increased fluid intake. Sports drinks offer valuable electrolytes, sugars, and fluids.  Breathing heated mist or steam (vaporizer or shower).  Eating chicken soup or other clear broths, and maintaining good nutrition.  Getting plenty of rest.  Using gargles or lozenges for comfort.  Controlling fevers with ibuprofen or acetaminophen as directed by your caregiver.  Increasing usage of your inhaler if you have asthma. Zinc gel and zinc lozenges, taken in the first 24 hours of the common cold, can shorten the duration and lessen the severity of symptoms. Pain medicines may help with fever, muscle  aches, and throat pain. A variety of non-prescription medicines are available to treat congestion and runny nose. Your caregiver can make recommendations and may suggest nasal or lung inhalers for other symptoms.  HOME CARE INSTRUCTIONS   Only take  over-the-counter or prescription medicines for pain, discomfort, or fever as directed by your caregiver.  Use a warm mist humidifier or inhale steam from a shower to increase air moisture. This may keep secretions moist and make it easier to breathe.  Drink enough water and fluids to keep your urine clear or pale yellow.  Rest as needed.  Return to work when your temperature has returned to normal or as your caregiver advises. You may need to stay home longer to avoid infecting others. You can also use a face mask and careful hand washing to prevent spread of the virus. SEEK MEDICAL CARE IF:   After the first few days, you feel you are getting worse rather than better.  You need your caregiver's advice about medicines to control symptoms.  You develop chills, worsening shortness of breath, or brown or red sputum. These may be signs of pneumonia.  You develop yellow or brown nasal discharge or pain in the face, especially when you bend forward. These may be signs of sinusitis.  You develop a fever, swollen neck glands, pain with swallowing, or white areas in the back of your throat. These may be signs of strep throat. SEEK IMMEDIATE MEDICAL CARE IF:   You have a fever.  You develop severe or persistent headache, ear pain, sinus pain, or chest pain.  You develop wheezing, a prolonged cough, cough up blood, or have a change in your usual mucus (if you have chronic lung disease).  You develop sore muscles or a stiff neck. Document Released: 10/30/2000 Document Revised: 07/29/2011 Document Reviewed: 09/07/2010 [facility]Exit[facility] Patient Information 2014 [employer], [place].

## 2013-06-30 ENCOUNTER — Telehealth: Payer: Self-pay

## 2013-06-30 NOTE — Telephone Encounter (Signed)
Could be due to swollen glands/congestion.  Let me know if it does not improve in 2-3 days.

## 2013-06-30 NOTE — Telephone Encounter (Signed)
Patient called and spoke to Gary DikeJennifer stating that he was seen on Monday and is now having pressure behind his ears? Is this normal?

## 2013-06-30 NOTE — Telephone Encounter (Signed)
Left detailed message on cell and to call if any questions. 

## 2013-10-06 ENCOUNTER — Ambulatory Visit (INDEPENDENT_AMBULATORY_CARE_PROVIDER_SITE_OTHER): Payer: BC Managed Care – PPO | Admitting: Family

## 2013-10-06 ENCOUNTER — Encounter: Payer: Self-pay | Admitting: Family

## 2013-10-06 VITALS — BP 114/70 | HR 52 | Temp 97.8°F | Resp 16 | Ht 73.0 in | Wt 222.1 lb

## 2013-10-06 DIAGNOSIS — L255 Unspecified contact dermatitis due to plants, except food: Secondary | ICD-10-CM

## 2013-10-06 DIAGNOSIS — L237 Allergic contact dermatitis due to plants, except food: Secondary | ICD-10-CM

## 2013-10-06 DIAGNOSIS — K649 Unspecified hemorrhoids: Secondary | ICD-10-CM

## 2013-10-06 DIAGNOSIS — L739 Follicular disorder, unspecified: Secondary | ICD-10-CM

## 2013-10-06 DIAGNOSIS — L738 Other specified follicular disorders: Secondary | ICD-10-CM

## 2013-10-06 MED ORDER — HYDROCORTISONE ACE-PRAMOXINE 1-1 % RE FOAM: 1.0000 | Freq: Two times a day (BID) | RECTAL | Status: DC

## 2013-10-06 MED ORDER — CEPHALEXIN 500 MG PO CAPS
500.0000 mg | ORAL_CAPSULE | Freq: Four times a day (QID) | ORAL | Status: DC
Start: 2013-10-06 — End: 2013-11-30

## 2013-10-06 MED ORDER — PREDNISONE 10 MG PO TABS: ORAL_TABLET | ORAL | Status: DC

## 2013-10-06 MED ORDER — METHYLPREDNISOLONE SODIUM SUCC 125 MG IJ SOLR
125.0000 mg | Freq: Once | INTRAMUSCULAR | Status: AC
  Administered 2013-10-06: 125 mg via INTRAMUSCULAR

## 2013-10-06 NOTE — Patient Instructions (Signed)
Start keflex for leg boil. Start prednisone for poison ivy, proctofoam for hemorrhoid. Call if symptoms worsen or if they do not improve.

## 2013-10-06 NOTE — Progress Notes (Signed)
Pre visit review using our clinic review tool, if applicable. No additional management support is needed unless otherwise documented below in the visit note. 

## 2013-10-06 NOTE — Progress Notes (Signed)
Subjective:    Patient ID: Gary Griffith, male    DOB: 11/21/1985, 28 y.o.   MRN: [id number]  HPI  Gary Griffith presents today with chief complaint of poison ivy. Using calamine.  Not helping much.  Taking clartin.    homorrhoids- using otc preparation H.  Notes that hemorrhoid is small.  Denies constipation.    Boil on thighs- reports that he has had one on his righ thigh and it won't seem to come to a head.  Tender.    Review of Systems See HPI  Past Medical History  Diagnosis Date  . Allergy     environmental--trees, grass, dust  . Asthma   . History of chicken pox   . GERD (gastroesophageal reflux disease)   . ADD (attention deficit disorder) 10/22/2010  . Otitis externa of left ear 12/19/2010    History   Social History  . Marital Status: Married    Spouse Name: N/A    Number of Children: 0  . Years of Education: N/A   Occupational History  . Not on file.   Social History Main Topics  . Smoking status: Never Smoker   . Smokeless tobacco: Never Used  . Alcohol Use: 0.6 oz/week    1 Cans of beer per week     Comment: occasionally  . Drug Use: No  . Sexual Activity: Yes    Partners: Female   Other Topics Concern  . Not on file   Social History Narrative   Regular exercise: not currently, plans to restart gym 3 x weekly   Caffeine Use: 1 soda daily.   Has 1 daughter born 09/2011- [name]   Married   Owns [employer]    Past Surgical History  Procedure Laterality Date  . No surgery      Family History  Problem Relation Age of Onset  . Hypertension Father   . Diabetes Maternal Grandmother   . Heart disease Maternal Grandmother     MI, stents  . Diabetes Paternal Grandfather   . Cancer Neg Hx     No Known Allergies  Current Outpatient Prescriptions on File Prior to Visit  Medication Sig Dispense Refill  . loratadine (CLARITIN) 10 MG tablet Take 10 mg by mouth daily.      Marland Kitchen. omeprazole (PRILOSEC) 20 MG capsule Take 20 mg by mouth daily.      . fish  oil-omega-3 fatty acids 1000 MG capsule Take 2 g by mouth 2 (two) times daily.      . sildenafil (VIAGRA) 50 MG tablet Take 1 tablet (50 mg total) by mouth daily as needed for erectile dysfunction.  10 tablet  3   No current facility-administered medications on file prior to visit.    BP 114/70  Pulse 52  Temp(Src) 97.8 F (36.6 C) (Oral)  Resp 16  Ht 6\' 1"  (1.854 m)  Wt 222 lb 1.3 oz (100.735 kg)  BMI 29.31 kg/m2  SpO2 99%       Objective:   Physical Exam  Constitutional: He is oriented to person, place, and time. He appears well-developed and well-nourished. No distress.  Pulmonary/Chest: Effort normal and breath sounds normal.  Neurological: He is alert and oriented to person, place, and time.  Skin:  Erythematous rash is streaks and macules. Small tender boil noted right upper/inner thigh.  Psychiatric: He has a normal mood and affect. His behavior is normal. Judgment and thought content normal.          Assessment &  Plan:

## 2013-10-07 DIAGNOSIS — K649 Unspecified hemorrhoids: Secondary | ICD-10-CM | POA: Insufficient documentation

## 2013-10-07 DIAGNOSIS — L739 Follicular disorder, unspecified: Secondary | ICD-10-CM | POA: Insufficient documentation

## 2013-10-07 DIAGNOSIS — L237 Allergic contact dermatitis due to plants, except food: Secondary | ICD-10-CM | POA: Insufficient documentation

## 2013-10-07 NOTE — Assessment & Plan Note (Signed)
rx with keflex.

## 2013-10-07 NOTE — Assessment & Plan Note (Signed)
Trial of proctofoam. Advised pt to avoid constipation, sitz baths prn. Follow up if symptoms worsen or if symptoms do not improve.

## 2013-10-07 NOTE — Assessment & Plan Note (Signed)
IM solumedrol to be followed by oral prednisone. Recommend benadryl prn.  Follow up if symptoms worsen or if they do not improve.

## 2013-11-15 ENCOUNTER — Ambulatory Visit: Payer: BC Managed Care – PPO | Admitting: Nurse Practitioner

## 2013-11-15 ENCOUNTER — Ambulatory Visit (INDEPENDENT_AMBULATORY_CARE_PROVIDER_SITE_OTHER): Payer: BC Managed Care – PPO | Admitting: Nurse Practitioner

## 2013-11-15 ENCOUNTER — Encounter: Payer: Self-pay | Admitting: Nurse Practitioner

## 2013-11-15 VITALS — BP 152/72 | HR 68 | Temp 98.0°F | Resp 18 | Ht 73.0 in | Wt 223.0 lb

## 2013-11-15 DIAGNOSIS — L74510 Primary focal hyperhidrosis, axilla: Secondary | ICD-10-CM

## 2013-11-15 DIAGNOSIS — L74519 Primary focal hyperhidrosis, unspecified: Secondary | ICD-10-CM

## 2013-11-15 DIAGNOSIS — H60399 Other infective otitis externa, unspecified ear: Secondary | ICD-10-CM

## 2013-11-15 DIAGNOSIS — H60393 Other infective otitis externa, bilateral: Secondary | ICD-10-CM

## 2013-11-15 MED ORDER — ALUMINUM CHLORIDE 20 % EX SOLN: CUTANEOUS | Status: DC

## 2013-11-15 MED ORDER — OFLOXACIN 0.3 % OT SOLN: 10.0000 [drp] | Freq: Every day | OTIC | Status: DC

## 2013-11-15 NOTE — Patient Instructions (Addendum)
Keep ears dry. No swimming for 10 days. Put cotton ball on ear before getting into shower, remove when getting out. Apply drops daily to both ears for at least 7 days. Keep head tilted for 5 minutes after applying drops. Never put cold drops in ears-warm bottle in hands or pocket for 5 minutes before using. You may put several drops vinegar in each ear once daily (Use vinegar at night, drops in am or vise-versa).For sinus drainage-use Neilmed sinus rinse daily for about 1 week.  Return in 10 days for re-evaluation.  Otitis Externa Otitis externa is a bacterial or fungal infection of the outer ear canal. This is the area from the eardrum to the outside of the ear. Otitis externa is sometimes called "swimmer's ear." CAUSES  Possible causes of infection include:  Swimming in dirty water.  Moisture remaining in the ear after swimming or bathing.  Mild injury (trauma) to the ear.  Objects stuck in the ear (foreign body).  Cuts or scrapes (abrasions) on the outside of the ear. SYMPTOMS  The first symptom of infection is often itching in the ear canal. Later signs and symptoms may include swelling and redness of the ear canal, ear pain, and yellowish-white fluid (pus) coming from the ear. The ear pain may be worse when pulling on the earlobe. DIAGNOSIS  Your caregiver will perform a physical exam. A sample of fluid may be taken from the ear and examined for bacteria or fungi. TREATMENT  Antibiotic ear drops are often given for 10 to 14 days. Treatment may also include pain medicine or corticosteroids to reduce itching and swelling. PREVENTION   Keep your ear dry. Use the corner of a towel to absorb water out of the ear canal after swimming or bathing.  Avoid scratching or putting objects inside your ear. This can damage the ear canal or remove the protective wax that lines the canal. This makes it easier for bacteria and fungi to grow.  Avoid swimming in lakes, polluted water, or poorly  chlorinated pools.  You may use ear drops made of rubbing alcohol and vinegar after swimming. Combine equal parts of white vinegar and alcohol in a bottle. Put 3 or 4 drops into each ear after swimming. HOME CARE INSTRUCTIONS   Apply antibiotic ear drops to the ear canal as prescribed by your caregiver.  Only take over-the-counter or prescription medicines for pain, discomfort, or fever as directed by your caregiver.  If you have diabetes, follow any additional treatment instructions from your caregiver.  Keep all follow-up appointments as directed by your caregiver. SEEK MEDICAL CARE IF:   You have a fever.  Your ear is still red, swollen, painful, or draining pus after 3 days.  Your redness, swelling, or pain gets worse.  You have a severe headache.  You have redness, swelling, pain, or tenderness in the area behind your ear. MAKE SURE YOU:   Understand these instructions.  Will watch your condition.  Will get help right away if you are not doing well or get worse. Document Released: 05/06/2005 Document Revised: 07/29/2011 Document Reviewed: 05/23/2011 [facility]ExitCare Patient Information 2015 [place]ExitCare, [place]LL[place]. This information is not intended to replace advice given to you by your health care Olumide Dolinger. Make sure you discuss any questions you have with your health care Shiah Berhow.

## 2013-11-15 NOTE — Progress Notes (Signed)
Pre visit review using our clinic review tool, if applicable. No additional management support is needed unless otherwise documented below in the visit note. 

## 2013-11-15 NOTE — Progress Notes (Signed)
   Subjective:    Patient ID: Gary Griffith, male    DOB: April 04, 1986, 28 y.o.   MRN: [id number]  HPI Comments: Pt also requesting refill for drysol. 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 Works well. Recently ran out. Has been using for 10 years.  Otalgia  There is pain in both ears. This is a new problem. The current episode started in the past 7 days. The problem occurs constantly. The problem has been gradually worsening. There has been no fever. The pain is mild. Associated symptoms include rhinorrhea. Pertinent negatives include no coughing, diarrhea, drainage, ear discharge, headaches, hearing loss, sore throat or vomiting. Associated symptoms comments: Recent swimming. Nasal congestion for 2 weeks.. Treatments tried: hydrogen peroxide in ears, nasacort & claritin for nasal congestion. The treatment provided mild (L ear feels better, R ear feels worse) relief.      Review of Systems  Constitutional: Negative for fever, chills, activity change and fatigue.  HENT: Positive for congestion (nasal), ear pain, postnasal drip and rhinorrhea. Negative for ear discharge, hearing loss, sinus pressure, sore throat, tinnitus and voice change.   Respiratory: Negative for cough.   Gastrointestinal: Negative for vomiting and diarrhea.  Neurological: Negative for headaches.       Objective:   Physical Exam  Vitals reviewed. Constitutional: He is oriented to person, place, and time. He appears well-developed and well-nourished. No distress.  HENT:  Head: Normocephalic and atraumatic.  Mouth/Throat: Oropharynx is clear and moist. No oropharyngeal exudate.  L canal mildly eythematous, small amount honey-colored debris in canal. TM opaque, intact. R canal appears wet, erythematous, mild swelling, TM is opaque, appears to be intact, but do not have full visualization due to canal swelling.  Eyes: Conjunctivae are normal. Right eye exhibits no discharge. Left eye exhibits no discharge.  Neck:  Normal range of motion. Neck supple. No thyromegaly present.  Cardiovascular: Normal rate.   Pulmonary/Chest: Effort normal. No respiratory distress.  Lymphadenopathy:    He has no cervical adenopathy.  Neurological: He is alert and oriented to person, place, and time.  Skin: Skin is warm and dry.  Psychiatric: He has a normal mood and affect. His behavior is normal. Thought content normal.          Assessment & Plan:  1. Hyperhidrosis of axilla - aluminum chloride (DRYSOL) 20 % external solution; Apply daily at bedtime. Decrease to 1-2 applications/week once sweating decreases.  Dispense: 60 mL; Refill: 0  2. Otitis, externa bilateral - ofloxacin (FLOXIN) 0.3 % otic solution; Place 10 drops into both ears daily.  Dispense: 10 mL; Refill: 0 See pt instructions. F/u 2 weeks.

## 2013-11-30 ENCOUNTER — Encounter: Payer: Self-pay | Admitting: Nurse Practitioner

## 2013-11-30 ENCOUNTER — Ambulatory Visit (INDEPENDENT_AMBULATORY_CARE_PROVIDER_SITE_OTHER): Payer: BC Managed Care – PPO | Admitting: Nurse Practitioner

## 2013-11-30 VITALS — BP 121/71 | HR 65 | Temp 98.2°F | Ht 73.0 in | Wt 222.0 lb

## 2013-11-30 DIAGNOSIS — H579 Unspecified disorder of eye and adnexa: Secondary | ICD-10-CM

## 2013-11-30 DIAGNOSIS — H60339 Swimmer's ear, unspecified ear: Secondary | ICD-10-CM | POA: Insufficient documentation

## 2013-11-30 DIAGNOSIS — H60399 Other infective otitis externa, unspecified ear: Secondary | ICD-10-CM

## 2013-11-30 DIAGNOSIS — H6093 Unspecified otitis externa, bilateral: Secondary | ICD-10-CM

## 2013-11-30 NOTE — Progress Notes (Signed)
Pre visit review using our clinic review tool, if applicable. No additional management support is needed unless otherwise documented below in the visit note. 

## 2013-11-30 NOTE — Assessment & Plan Note (Addendum)
Clear papule 1mm L lateral sclera, surrounding moderately injected conjunctiva. C/o mild pain. Call eye doctor for eval. In the meantime, Use moisture drop or saline drops.

## 2013-11-30 NOTE — Patient Instructions (Signed)
Ears look good. Apply few drops vinegar in ears after swimming to prevent infection. Call eye doctor for Left eye irritation. In the meantime use a moisture drop several times daily.

## 2013-11-30 NOTE — Assessment & Plan Note (Signed)
Apply few drops vinegar after swimming to prevent infection.

## 2013-11-30 NOTE — Progress Notes (Signed)
Subjective:     Gary Griffith is a 28 y.o. male who presents for follow up of bilateral otitis externa.  He does have a history of recent swimming and will continue to swim occasionally throughout summer. He feels symptoms have resolved. He complete 7 days ofloxacin drops. He is concerned about L eye irritation that started yesterday. He describes mild pain. Denies itching , discharge, & vision change.   The patient's history has been marked as reviewed and updated as appropriate.   Review of Systems Pertinent items are noted in HPI.   Objective:    BP 121/71  Pulse 65  Temp(Src) 98.2 F (36.8 C) (Temporal)  Ht 6\' 1"  (1.854 m)  Wt 222 lb (100.699 kg)  BMI 29.30 kg/m2  SpO2 94% General:  alert, cooperative, appears stated age and no distress  Right Ear: normal appearance  Left Ear:  Eyes: TM very opaque-almost white, unable to visualize bones. Canal much improved, but still has moist appearance & mild erythema.  L lateral lesion-clear papule, 1 mm. Moderately injected conjunctiva surrounding lesion. R eye mildly injected conjunctiva.             Assessment:   1. Otitis externa, swimmer's ear, bilateral Resolved w/ofloxacin drops.  2. Eye lesion  See problem list for complete A&P See pt instructions. F/u PRN

## 2014-03-03 ENCOUNTER — Encounter: Payer: Self-pay | Admitting: Nurse Practitioner

## 2014-03-03 ENCOUNTER — Ambulatory Visit (INDEPENDENT_AMBULATORY_CARE_PROVIDER_SITE_OTHER): Payer: BC Managed Care – PPO | Admitting: Nurse Practitioner

## 2014-03-03 VITALS — BP 120/75 | HR 72 | Temp 97.6°F | Ht 73.0 in | Wt 230.0 lb

## 2014-03-03 DIAGNOSIS — L03011 Cellulitis of right finger: Secondary | ICD-10-CM

## 2014-03-03 DIAGNOSIS — IMO0002 Reserved for concepts with insufficient information to code with codable children: Secondary | ICD-10-CM | POA: Insufficient documentation

## 2014-03-03 DIAGNOSIS — Z23 Encounter for immunization: Secondary | ICD-10-CM

## 2014-03-03 MED ORDER — SULFAMETHOXAZOLE-TMP DS 800-160 MG PO TABS: 1.0000 | ORAL_TABLET | Freq: Two times a day (BID) | ORAL | Status: DC

## 2014-03-03 NOTE — Progress Notes (Signed)
Pre visit review using our clinic review tool, if applicable. No additional management support is needed unless otherwise documented below in the visit note. 

## 2014-03-03 NOTE — Patient Instructions (Signed)
You have a paronychia.  Start antibiotic. Eat yogurt daily to help prevent antibiotic -associated diarrhea.  Soak finger 3 times daily in hot water, listerene, and hydrogen peroxide. Equal parts. Keep finger in solution for about 10 minutes-til cools off.  Return in 2 weeks if not improved or sooner if you have concerns.

## 2014-03-03 NOTE — Progress Notes (Signed)
Subjective:    Gary Griffith is a 28 y.o. male who presents for evaluation of paronychia R 3rd finger. Finger became painful & red yesterday. No drainage, fever.Pt states he bites nails. The following portions of the patient's history were reviewed and updated as appropriate: allergies, current medications, past medical history, past social history, past surgical history and problem list.  Review of Systems Pertinent items are noted in HPI.     Objective:    BP 120/75  Pulse 72  Temp(Src) 97.6 F (36.4 C) (Temporal)  Ht 6\' 1"  (1.854 m)  Wt 230 lb (104.327 kg)  BMI 30.35 kg/m2  SpO2 97% General appearance: alert, cooperative, appears stated age and no distress Head: Normocephalic, without obvious abnormality, atraumatic Eyes: negative findings: lids and lashes normal and conjunctivae and sclerae normal Extremities: extremities normal, atraumatic, no cyanosis or edema   Skin: mildly swollen red skin at base of R 3rd fingernail. Unable to express drainage  Assessment:  1. Paronychia, right - sulfamethoxazole-trimethoprim (BACTRIM DS) 800-160 MG per tablet; Take 1 tablet by mouth 2 (two) times daily.  Dispense: 20 tablet; Refill: 0 Daily soaks See pt instructions. F/u PRN 2 weeks.  2. Need for prophylactic vaccination and inoculation against influenza - Flu Vaccine QUAD 36+ mos IM

## 2014-03-17 ENCOUNTER — Encounter: Payer: BC Managed Care – PPO | Admitting: Family

## 2014-04-05 ENCOUNTER — Encounter: Payer: BC Managed Care – PPO | Admitting: Family

## 2014-04-25 ENCOUNTER — Encounter: Payer: BC Managed Care – PPO | Admitting: Family

## 2014-05-30 ENCOUNTER — Encounter: Payer: Self-pay | Admitting: Family

## 2014-06-01 ENCOUNTER — Encounter: Payer: Self-pay | Admitting: Family

## 2014-06-06 ENCOUNTER — Ambulatory Visit: Payer: Self-pay | Admitting: Family Medicine

## 2014-06-07 ENCOUNTER — Encounter: Payer: Self-pay | Admitting: Family Medicine

## 2014-06-07 ENCOUNTER — Ambulatory Visit (INDEPENDENT_AMBULATORY_CARE_PROVIDER_SITE_OTHER): Payer: BLUE CROSS/BLUE SHIELD | Admitting: Family Medicine

## 2014-06-07 VITALS — BP 144/81 | HR 62 | Temp 97.6°F | Ht 73.0 in | Wt 232.0 lb

## 2014-06-07 DIAGNOSIS — J0181 Other acute recurrent sinusitis: Secondary | ICD-10-CM

## 2014-06-07 MED ORDER — DOXYCYCLINE HYCLATE 100 MG PO TABS: 100.0000 mg | ORAL_TABLET | Freq: Two times a day (BID) | ORAL | Status: DC

## 2014-06-07 NOTE — Progress Notes (Signed)
OFFICE NOTE  06/07/2014  CC:  Chief Complaint  Patient presents with  . Sinusitis   HPI: Patient is a 29 y.o. Caucasian male who is here for "sinus issues".   Onset 2 wks ago: nasal congestion, PND, sinus pressure/ears feel pressure, body achiness the last few days, pressure in ears. Some coughing but no wheezing or SOB.  No fever. Sx's not improved any since onset.  No n/v/d or rash. Added nasocort AQ OTC to his daily claritin.  Also using Mucinex D.   Pertinent PMH:  Past medical, surgical, social, and family history reviewed and no changes are noted since last office visit.  MEDS: Not taking bactrim DS listed below Outpatient Prescriptions Prior to Visit  Medication Sig Dispense Refill  . loratadine (CLARITIN) 10 MG tablet Take 10 mg by mouth daily.    Marland Kitchen. omeprazole (PRILOSEC) 20 MG capsule Take 20 mg by mouth daily.    Marland Kitchen. sulfamethoxazole-trimethoprim (BACTRIM DS) 800-160 MG per tablet Take 1 tablet by mouth 2 (two) times daily. (Patient not taking: Reported on 06/07/2014) 20 tablet 0   No facility-administered medications prior to visit.    PE: Blood pressure 144/81, pulse 62, temperature 97.6 F (36.4 C), temperature source Temporal, height 6\' 1"  (1.854 m), weight 232 lb (105.235 kg), SpO2 98 %. VS: noted--normal. Gen: alert, NAD, NONTOXIC APPEARING. HEENT: eyes without injection, drainage, or swelling.  Ears: EACs clear, TMs with normal light reflex and landmarks.  Nose: Clear rhinorrhea, with some dried, crusty exudate adherent to mildly injected mucosa.  No purulent d/c.  No paranasal sinus TTP.  No facial swelling.  Throat and mouth without focal lesion.  No pharyngial swelling, erythema, or exudate.   Neck: supple, no LAD.   LUNGS: CTA bilat, nonlabored resps.   CV: RRR, no m/r/g. EXT: no c/c/e SKIN: no rash  IMPRESSION AND PLAN: Prolonged URI/acute sinusitis. Doxycycline 100 mg bid x 10d. Continue nasacort AQ OTC, claritin 10mg  qd, mucinex D, add saline nasal  spray tid prn.  An After Visit Summary was printed and given to the patient.  FOLLOW UP: prn

## 2014-06-07 NOTE — Progress Notes (Signed)
Pre visit review using our clinic review tool, if applicable. No additional management support is needed unless otherwise documented below in the visit note. 

## 2014-06-09 ENCOUNTER — Encounter: Payer: Self-pay | Admitting: Family Medicine

## 2014-06-09 ENCOUNTER — Ambulatory Visit (INDEPENDENT_AMBULATORY_CARE_PROVIDER_SITE_OTHER): Payer: BLUE CROSS/BLUE SHIELD | Admitting: Family Medicine

## 2014-06-09 VITALS — BP 138/83 | HR 79 | Temp 97.6°F | Resp 18 | Ht 72.0 in | Wt 229.0 lb

## 2014-06-09 DIAGNOSIS — Z0000 Encounter for general adult medical examination without abnormal findings: Secondary | ICD-10-CM

## 2014-06-09 LAB — COMPREHENSIVE METABOLIC PANEL
ALK PHOS: 98 U/L (ref 39–117)
ALT: 49 U/L (ref 0–53)
AST: 29 U/L (ref 0–37)
Albumin: 4.8 g/dL (ref 3.5–5.2)
BILIRUBIN TOTAL: 0.7 mg/dL (ref 0.2–1.2)
BUN: 17 mg/dL (ref 6–23)
CALCIUM: 10 mg/dL (ref 8.4–10.5)
CO2: 27 meq/L (ref 19–32)
Chloride: 103 mEq/L (ref 96–112)
Creatinine, Ser: 0.91 mg/dL (ref 0.40–1.50)
GFR: 105.08 mL/min (ref >60.00)
Glucose, Bld: 88 mg/dL (ref 70–99)
POTASSIUM: 4.1 meq/L (ref 3.5–5.1)
Sodium: 139 mEq/L (ref 135–145)
Total Protein: 7.2 g/dL (ref 6.0–8.3)

## 2014-06-09 LAB — CBC WITH DIFFERENTIAL/PLATELET
BASOS PCT: 0.2 % (ref 0.0–3.0)
Basophils Absolute: 0 10*3/uL (ref 0.0–0.1)
EOS PCT: 1.7 % (ref 0.0–5.0)
Eosinophils Absolute: 0.2 10*3/uL (ref 0.0–0.7)
HEMATOCRIT: 48.9 % (ref 39.0–52.0)
Hemoglobin: 16.6 g/dL (ref 13.0–17.0)
Lymphocytes Relative: 36.4 % (ref 12.0–46.0)
Lymphs Abs: 3.3 10*3/uL (ref 0.7–4.0)
MCHC: 34 g/dL (ref 30.0–36.0)
MCV: 84.3 fl (ref 78.0–100.0)
Monocytes Absolute: 0.5 10*3/uL (ref 0.1–1.0)
Monocytes Relative: 5.5 % (ref 3.0–12.0)
NEUTROS ABS: 5.1 10*3/uL (ref 1.4–7.7)
NEUTROS PCT: 56.2 % (ref 43.0–77.0)
Platelets: 200 10*3/uL (ref 150.0–400.0)
RBC: 5.8 Mil/uL (ref 4.22–5.81)
RDW: 13.1 % (ref 11.5–15.5)
WBC: 9 10*3/uL (ref 4.0–10.5)

## 2014-06-09 LAB — LIPID PANEL
CHOL/HDL RATIO: 4
Cholesterol: 154 mg/dL (ref 0–200)
HDL: 35 mg/dL — ABNORMAL LOW (ref >39.00)
NONHDL: 119
Triglycerides: 206 mg/dL — ABNORMAL HIGH (ref 0.0–149.0)
VLDL: 41.2 mg/dL — AB (ref 0.0–40.0)

## 2014-06-09 LAB — TSH: TSH: 2.44 u[IU]/mL (ref 0.35–4.50)

## 2014-06-09 NOTE — Assessment & Plan Note (Signed)
Reviewed age and gender appropriate health maintenance issues (prudent diet, regular exercise, health risks of tobacco and excessive alcohol, use of seatbelts, fire alarms in home, use of sunscreen).  Also reviewed age and gender appropriate health screening as well as vaccine recommendations. [facility] to [facility] wt/get trigs down/improve fatty liver. HP labs drawn today.

## 2014-06-09 NOTE — Progress Notes (Signed)
Office Note 06/09/2014  CC:  Chief Complaint  Patient presents with  . Annual Exam    HPI:  Gary Griffith is a 29 y.o. White male who is transferring care to me from [facility], wants CPE. I saw him 2d ago and started him on doxycycline for acute sinusitis.  He says he is feeling better.  No new/acute complaints.  Past Medical History  Diagnosis Date  . Environmental allergies     environmental--trees, grass, dust: also various metals make his hands break out (claritin  qd helps this)  . Asthma   . GERD (gastroesophageal reflux disease)   . ADD (attention deficit disorder) 10/22/2010  . Fatty liver 2012    noted on abd u/s.  AST and ALT mildly elevated 01/2013  . Obesity (BMI 30.0-34.9)     Past Surgical History  Procedure Laterality Date  . No surgery      Family History  Problem Relation Age of Onset  . Hypertension Father   . Diabetes Maternal Grandmother   . Heart disease Maternal Grandmother     MI, stents  . Diabetes Paternal Grandfather   . Cancer Neg Hx     History   Social History  . Marital Status: Married    Spouse Name: N/A    Number of Children: 0  . Years of Education: N/A   Occupational History  . Not on file.   Social History Main Topics  . Smoking status: Never Smoker   . Smokeless tobacco: Never Used  . Alcohol Use: 0.6 oz/week    1 Cans of beer per week     Comment: occasionally  . Drug Use: No  . Sexual Activity:    Partners: Female   Other Topics Concern  . Not on file   Social History Narrative   Regular exercise: not currently, plans to restart gym 3 x weekly   Caffeine Use: 1 soda daily.   Has 1 daughter born 09/2011- Loura Pardon   Married.  Lives in [place].   Owns [employer]   Not taking omeprazole but is taking zantac OTC bid prn Outpatient Prescriptions Prior to Visit  Medication Sig Dispense Refill  . doxycycline (VIBRA-TABS) 100 MG tablet Take 1 tablet (100 mg total) by mouth 2 (two) times daily. 20  tablet 0  . loratadine (CLARITIN) 10 MG tablet Take 10 mg by mouth daily.    Marland Kitchen omeprazole (PRILOSEC) 20 MG capsule Take 20 mg by mouth daily.     No facility-administered medications prior to visit.    No Known Allergies  ROS Review of Systems  Constitutional: Negative for fever, chills, appetite change and fatigue.  HENT: Positive for congestion, postnasal drip, sinus pressure and sneezing. Negative for dental problem, ear pain and sore throat.   Eyes: Negative for discharge, redness and visual disturbance.  Respiratory: Negative for cough, chest tightness, shortness of breath and wheezing.   Cardiovascular: Negative for chest pain, palpitations and leg swelling.  Gastrointestinal: Negative for nausea, vomiting, abdominal pain, diarrhea and blood in stool.  Genitourinary: Negative for dysuria, urgency, frequency, hematuria, flank pain and difficulty urinating.  Musculoskeletal: Negative for myalgias, back pain, joint swelling, arthralgias and neck stiffness.  Skin: Negative for pallor and rash.  Neurological: Negative for dizziness, speech difficulty, weakness and headaches.  Hematological: Negative for adenopathy. Does not bruise/bleed easily.  Psychiatric/Behavioral: Negative for confusion and sleep disturbance. The patient is not nervous/anxious.     PE; Blood pressure 138/83, pulse 79, temperature 97.6 F (  36.4 C), temperature source Oral, resp. rate 18, height 6' (1.829 m), weight 229 lb (103.874 kg), SpO2 98 %. Gen: Alert, well appearing.  Patient is oriented to person, place, time, and situation. AFFECT: pleasant, lucid thought and speech. ENT: Ears: EACs clear, normal epithelium.  TMs with good light reflex and landmarks bilaterally.  Eyes: no injection, icteris, swelling, or exudate.  EOMI, PERRLA. Nose: no drainage or turbinate edema/swelling.  No injection or focal lesion.  Mouth: lips without lesion/swelling.  Oral mucosa pink and moist.  Dentition intact and without  obvious caries or gingival swelling.  Oropharynx without erythema, exudate, or swelling.  Neck: supple/nontender.  No LAD, mass, or TM.  Carotid pulses 2+ bilaterally, without bruits. CV: RRR, no m/r/g.   LUNGS: CTA bilat, nonlabored resps, good aeration in all lung fields. ABD: soft, NT, ND, BS normal.  No hepatospenomegaly or mass.  No bruits. EXT: no clubbing, cyanosis, or edema.  Musculoskeletal: no joint swelling, erythema, warmth, or tenderness.  ROM of all joints intact. Skin - no sores or suspicious lesions or rashes or color changes  Pertinent labs:  None today   Chemistry      Component Value Date/Time   NA 136 02/03/2013 0816   K 4.3 02/03/2013 0816   CL 101 02/03/2013 0816   CO2 28 02/03/2013 0816   BUN 16 02/03/2013 0816   CREATININE 0.77 02/03/2013 0816      Component Value Date/Time   CALCIUM 9.4 02/03/2013 0816   ALKPHOS 96 02/03/2013 0816   AST 44* 02/03/2013 0816   ALT 80* 02/03/2013 0816   BILITOT 0.8 02/03/2013 0816     Lab Results  Component Value Date   WBC 6.8 02/03/2013   HGB 14.2 02/03/2013   HCT 40.8 02/03/2013   MCV 80.3 02/03/2013   PLT 173 02/03/2013   Lab Results  Component Value Date   TSH 2.002 02/03/2013   Lab Results  Component Value Date   CHOL 145 02/03/2013   HDL 23* 02/03/2013   LDLCALC 56 02/03/2013   TRIG 332* 02/03/2013   CHOLHDL 6.3 02/03/2013   ASSESSMENT AND PLAN:   Health maintenance examination Reviewed age and gender appropriate health maintenance issues (prudent diet, regular exercise, health risks of tobacco and excessive alcohol, use of seatbelts, fire alarms in home, use of sunscreen).  Also reviewed age and gender appropriate health screening as well as vaccine recommendations. [facility]/get trigs down/improve fatty liver. HP labs drawn today.   Vaccines are UTD.  An After Visit Summary was printed and given to the patient.  FOLLOW UP:  Return in about 1 year  (around 06/10/2015) for annual CPE (fasting).

## 2014-06-09 NOTE — Progress Notes (Signed)
Pre visit review using our clinic review tool, if applicable. No additional management support is needed unless otherwise documented below in the visit note. 

## 2014-06-13 LAB — LDL CHOLESTEROL, DIRECT: LDL DIRECT: 89 mg/dL

## 2014-12-15 ENCOUNTER — Telehealth: Payer: Self-pay | Admitting: Family Medicine

## 2014-12-15 NOTE — Telephone Encounter (Signed)
Rx refill request from [facility] for hypercare 20% solution 37.5 ML sig apply daily QHS.  Decrease to 1-2 applications / week once sweating decreases? This is not in patient's chart.  Please advise.

## 2014-12-16 MED ORDER — ALUMINUM CHLORIDE 20 % EX SOLN: CUTANEOUS | Status: DC

## 2014-12-16 NOTE — Telephone Encounter (Signed)
Rx for hypercare done as per pt request.

## 2015-01-11 ENCOUNTER — Ambulatory Visit (INDEPENDENT_AMBULATORY_CARE_PROVIDER_SITE_OTHER): Payer: BLUE CROSS/BLUE SHIELD | Admitting: Family Medicine

## 2015-01-11 ENCOUNTER — Encounter: Payer: Self-pay | Admitting: Family Medicine

## 2015-01-11 VITALS — BP 143/81 | HR 70 | Temp 98.0°F | Resp 16 | Ht 72.0 in | Wt 230.0 lb

## 2015-01-11 DIAGNOSIS — L03011 Cellulitis of right finger: Secondary | ICD-10-CM | POA: Diagnosis not present

## 2015-01-11 DIAGNOSIS — IMO0001 Reserved for inherently not codable concepts without codable children: Secondary | ICD-10-CM

## 2015-01-11 DIAGNOSIS — L649 Androgenic alopecia, unspecified: Secondary | ICD-10-CM

## 2015-01-11 MED ORDER — FINASTERIDE 1 MG PO TABS: 1.0000 mg | ORAL_TABLET | Freq: Every day | ORAL | Status: DC

## 2015-01-11 NOTE — Progress Notes (Signed)
OFFICE NOTE   01/11/2015   CC:   Chief Complaint   Patient presents with   .  Hand Pain       3rd digit on right hand     HPI: Patient is a 29 y.o. Caucasian male who is here for 5d of R hand middle finger swelling/pain/redness around nail edge and this extended down finger [facility]--there was nothing to be drained at that time.  No fever.  No malaise.    The redness is responding to keflex but distal aspect of finger at nail fold is swollen and changed colors and may need drainage.   Also with hair loss over the last couple years, thinning a lot over vertex and some in mid-frontal region, besides the usual male pattern loss.  Asks about propecia.   Pertinent PMH:   Past medical, surgical, social, and family history reviewed and no changes are noted since last office visit.  MEDS:   Outpatient Prescriptions Prior to Visit   Medication  Sig  Dispense  Refill   .  aluminum chloride (DRYSOL) 20 % external solution  Apply qhs, and gradually decrease to 1-2 applications per week as sweating decreases  35 mL  6   .  loratadine (CLARITIN) 10 MG tablet  Take 10 mg by mouth daily.       Keflex 500 mg    No facility-administered medications prior to visit.      PE: Blood pressure 143/81, pulse 70, temperature 98 F (36.7 C), temperature source Oral, resp. rate 16, height 6' (1.829 m), weight 230 lb (104.327 kg), SpO2 96 %. Gen: Alert, well appearing.  Patient is oriented to person, place, time, and situation. Scalp: extensive hair thinning over vertex of scalp and less thinning noted in midline of frontal aspect of scalp.  Male-pattern loss noted above both temples. Right hand middle finger distal phalanx with violaceous hue/erythema, fluctuant swelling up to the nail bed, with small area of translucency right next to nail fold.  Nail appears normal.  Remainder of finger is without swelling or erythema and ROM of finger is normal.  Area of swelling  and redness is tender to palpation.   IMPRESSION AND PLAN:  1) Paronychia, right hand middle finger.   Cellulitic component has cleared with keflex--finish this. Procedure today: Incision and drainage of paronychia  The indication for the procedure was explained to the patient, benefits and risks of procedure were outlined for patient, patient agreed to proceed.  Steps of the procedure were clearly explained to the patient prior to starting. Digital block of middle finger done with 2 cc of 2% plain lidocaine on each side of finger.  Incised central portion of the nearly translucent aspect of the lesion with scalpel and used manual pressure  to express contents and encourage complete drainage--all of contents of the abscess drained today.   Wound dressed.  Patient tolerated procedure well.  No immediate complications.  Wound care instructions given.  Warning signs of infection discussed. Follow up discussed.  Call or return for problems.  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  FOLLOW UP:  11mo f/u androgenic alopecia

## 2015-01-11 NOTE — Progress Notes (Signed)
Pre visit review using our clinic review tool, if applicable. No additional management support is needed unless otherwise documented below in the visit note. 

## 2015-03-28 ENCOUNTER — Ambulatory Visit: Payer: BLUE CROSS/BLUE SHIELD | Admitting: Family Medicine

## 2015-03-29 ENCOUNTER — Encounter: Payer: Self-pay | Admitting: Family Medicine

## 2015-03-29 ENCOUNTER — Ambulatory Visit (INDEPENDENT_AMBULATORY_CARE_PROVIDER_SITE_OTHER): Payer: BLUE CROSS/BLUE SHIELD | Admitting: Family Medicine

## 2015-03-29 VITALS — BP 117/77 | HR 82 | Temp 98.0°F | Resp 20 | Wt 230.8 lb

## 2015-03-29 DIAGNOSIS — J069 Acute upper respiratory infection, unspecified: Secondary | ICD-10-CM | POA: Diagnosis not present

## 2015-03-29 DIAGNOSIS — J029 Acute pharyngitis, unspecified: Secondary | ICD-10-CM | POA: Diagnosis not present

## 2015-03-29 MED ORDER — AMOXICILLIN-POT CLAVULANATE 875-125 MG PO TABS: 1.0000 | ORAL_TABLET | Freq: Two times a day (BID) | ORAL | Status: DC

## 2015-03-29 NOTE — Progress Notes (Signed)
   Subjective:    Patient ID: Gary Griffith, male    DOB: 14-Aug-1985, [age] y.o.   MRN: [id number]  HPI  Sore throat: Patient presents for acute office visit with a 2 day history of redness and drainage of his left eye, mild sore throat, increased snoring, oxygen, myalgias. Patient denies nausea, vomit, diarrhea, rash, sinus congestion, rhinorrhea, ear pain or decreased appetite. Patient states he is eating and drinking well. He saw the eye doctor yesterday who prescribed him tobramycin/dexamethasone drops. He reports that his wife had pinkeye a few days ago as well, and his 10547-year-old daughter was recently diagnosed with strep throat. Patient reports he has been taking ibuprofen 800 mg every 6-8 hours to help with myalgias. Patient denies any fever or chills. Patient did not receive his flu shot this year. Patient denies headache or cough.  Never smoker  Past Medical History  Diagnosis Date  . Environmental allergies     environmental--trees, grass, dust: also various metals make his hands break out (claritin 10mg  qd helps this)  . Asthma   . GERD (gastroesophageal reflux disease)   . ADD (attention deficit disorder) 10/22/2010  . Fatty liver 2012    noted on abd u/s.  AST and ALT mildly elevated 01/2013  . Obesity (BMI 30.0-34.9)    No Known Allergies  Review of Systems Negative, with the exception of above mentioned in HPI     Objective:   Physical Exam BP 117/77 mmHg  Pulse 82  Temp(Src) 98 F (36.7 C) (Tympanic)  Resp 20  Wt 230 lb 12 oz (104.668 kg)  SpO2 95% Gen: Afebrile. No acute distress. Nontoxic in appearance, well-developed, well-nourished, Caucasian male. Very pleasant. HENT: AT. [place]. Bilateral TM visualized and normal in appearance. MMM. Bilateral nares with mild erythema left near, abdominal swelling. Throat with mild erythema, no exudates. No cough on exam. No hoarseness on exam. No tenderness to palpation over facial sinuses. Eyes:Pupils Equal Round Reactive to light,  Extraocular movements intact,  Conjunctiva with mild redness left eye. No discharge/drainage or icterus noted. Neck/lymp/endocrine: Supple, no lymphadenopathy CV: RRR  Chest: CTAB, no wheeze or crackles Skin: No rashes, purpura or petechiae.     Assessment & Plan:  1. Sore throat - POCT rapid strep A--> negative  2. Acute upper respiratory infection - Ask us with patient to restart his Nasacort, use nasal saline at least 3-4 times a day, rest, hydration, Advil/Tylenol for myalgias. Patient's symptoms are likely viral in nature. - Provided printed prescription for Augmentin, for patient to start his symptoms are not improving or worsening in 7 days. If improvement patient is to throw away prescription. Patient understands and is in agreement with plan. - amoxicillin-clavulanate (AUGMENTIN) 875-125 MG tablet; Take 1 tablet by mouth 2 (two) times daily.  Dispense: 20 tablet; Refill: 0

## 2015-03-29 NOTE — Patient Instructions (Addendum)
Printed script for Augmentin to be used in 7 days, if no improvement or worsening.  Use nasal spray and nasal saline.  Nasal saline should be uses 3x a day.

## 2015-06-06 ENCOUNTER — Other Ambulatory Visit (INDEPENDENT_AMBULATORY_CARE_PROVIDER_SITE_OTHER): Payer: BLUE CROSS/BLUE SHIELD

## 2015-06-06 DIAGNOSIS — Z0000 Encounter for general adult medical examination without abnormal findings: Secondary | ICD-10-CM | POA: Diagnosis not present

## 2015-06-06 LAB — LIPID PANEL
CHOL/HDL RATIO: 4
CHOLESTEROL: 164 mg/dL (ref 0–200)
HDL: 37.6 mg/dL — ABNORMAL LOW (ref >39.00)
LDL CALC: 95 mg/dL (ref 0–99)
NonHDL: 126.31
TRIGLYCERIDES: 157 mg/dL — AB (ref 0.0–149.0)
VLDL: 31.4 mg/dL (ref 0.0–40.0)

## 2015-06-06 LAB — CBC WITH DIFFERENTIAL/PLATELET
BASOS PCT: 0.4 % (ref 0.0–3.0)
Basophils Absolute: 0 10*3/uL (ref 0.0–0.1)
EOS PCT: 1.6 % (ref 0.0–5.0)
Eosinophils Absolute: 0.1 10*3/uL (ref 0.0–0.7)
HCT: 48.5 % (ref 39.0–52.0)
HEMOGLOBIN: 16.1 g/dL (ref 13.0–17.0)
Lymphocytes Relative: 40.6 % (ref 12.0–46.0)
Lymphs Abs: 2.4 10*3/uL (ref 0.7–4.0)
MCHC: 33.2 g/dL (ref 30.0–36.0)
MCV: 85.5 fl (ref 78.0–100.0)
Monocytes Absolute: 0.4 10*3/uL (ref 0.1–1.0)
Monocytes Relative: 5.9 % (ref 3.0–12.0)
Neutro Abs: 3 10*3/uL (ref 1.4–7.7)
Neutrophils Relative %: 51.5 % (ref 43.0–77.0)
PLATELETS: 185 10*3/uL (ref 150.0–400.0)
RBC: 5.67 Mil/uL (ref 4.22–5.81)
RDW: 13.3 % (ref 11.5–15.5)
WBC: 5.9 10*3/uL (ref 4.0–10.5)

## 2015-06-06 LAB — COMPREHENSIVE METABOLIC PANEL
ALK PHOS: 75 U/L (ref 39–117)
ALT: 49 U/L (ref 0–53)
AST: 29 U/L (ref 0–37)
Albumin: 4.7 g/dL (ref 3.5–5.2)
BILIRUBIN TOTAL: 0.5 mg/dL (ref 0.2–1.2)
BUN: 15 mg/dL (ref 6–23)
CO2: 30 mEq/L (ref 19–32)
Calcium: 9.6 mg/dL (ref 8.4–10.5)
Chloride: 103 mEq/L (ref 96–112)
Creatinine, Ser: 0.97 mg/dL (ref 0.40–1.50)
GFR: 96.94 mL/min (ref >60.00)
GLUCOSE: 92 mg/dL (ref 70–99)
POTASSIUM: 4.3 meq/L (ref 3.5–5.1)
SODIUM: 140 meq/L (ref 135–145)
TOTAL PROTEIN: 7 g/dL (ref 6.0–8.3)

## 2015-06-06 LAB — TSH: TSH: 2.28 u[IU]/mL (ref 0.35–4.50)

## 2015-06-13 ENCOUNTER — Encounter: Payer: Self-pay | Admitting: Family Medicine

## 2015-06-13 ENCOUNTER — Ambulatory Visit (INDEPENDENT_AMBULATORY_CARE_PROVIDER_SITE_OTHER): Payer: BLUE CROSS/BLUE SHIELD | Admitting: Family Medicine

## 2015-06-13 VITALS — BP 113/66 | HR 57 | Temp 97.7°F | Resp 16 | Ht 72.25 in | Wt 233.5 lb

## 2015-06-13 DIAGNOSIS — Z0000 Encounter for general adult medical examination without abnormal findings: Secondary | ICD-10-CM | POA: Diagnosis not present

## 2015-06-13 NOTE — Patient Instructions (Signed)
Goal exercise is to get HR to 160-170 range for 20-30 min 5 of 7 days per week.  Shoot for a weight loss goal of 30 pounds over the next 1 year.

## 2015-06-13 NOTE — Progress Notes (Signed)
Pre visit review using our clinic review tool, if applicable. No additional management support is needed unless otherwise documented below in the visit note. 

## 2015-06-13 NOTE — Progress Notes (Signed)
Office Note 06/13/2015  CC:  Chief Complaint  Patient presents with  . Annual Exam    Pt is not fasting.     HPI:  Gary Griffith is a 30 y.o. White male who is here for annual health maintenance exam. Recent eye exam showed small cataracts OU.  Not affecting vision at this time. No acute complaints.    Past Medical History  Diagnosis Date  . Environmental allergies     environmental--trees, grass, dust: also various metals make his hands break out (claritin  qd helps this)  . Asthma   . GERD (gastroesophageal reflux disease)   . ADD (attention deficit disorder) 10/22/2010  . Fatty liver 2012    noted on abd u/s.  AST and ALT mildly elevated 01/2013  . Obesity (BMI 30.0-34.9)     Past Surgical History  Procedure Laterality Date  . No surgery      Family History  Problem Relation Age of Onset  . Hypertension Father   . Diabetes Maternal Grandmother   . Heart disease Maternal Grandmother     MI, stents  . Diabetes Paternal Grandfather   . Cancer Neg Hx     Social History   Social History  . Marital Status: Married    Spouse Name: N/A  . Number of Children: 0  . Years of Education: N/A   Occupational History  . Not on file.   Social History Main Topics  . Smoking status: Never Smoker   . Smokeless tobacco: Never Used  . Alcohol Use: 0.6 oz/week    1 Cans of beer per week     Comment: occasionally  . Drug Use: No  . Sexual Activity:    Partners: Female   Other Topics Concern  . Not on file   Social History Narrative   Regular exercise: not currently, plans to restart gym 3 x weekly   Caffeine Use: 1 soda daily.   Has 1 daughter born 09/2011- Gary Griffith   Married.  Lives in [place].   Owns [employer]    Outpatient Prescriptions Prior to Visit  Medication Sig Dispense Refill  . finasteride (PROPECIA) 1 MG tablet Take 1 tablet (1 mg total) by mouth daily. 30 tablet 11  . loratadine (CLARITIN) 10 MG tablet Take 10 mg by mouth daily.    Marland Kitchen  omeprazole (PRILOSEC) 20 MG capsule Take 20 mg by mouth daily.    Marland Kitchen amoxicillin-clavulanate (AUGMENTIN) 875-125 MG tablet Take 1 tablet by mouth 2 (two) times daily. (Patient not taking: Reported on 06/13/2015) 20 tablet 0  . ranitidine (ZANTAC) 150 MG tablet Take 150 mg by mouth daily. Reported on 06/13/2015     No facility-administered medications prior to visit.    No Known Allergies  ROS Review of Systems  PE; Blood pressure 113/66, pulse 57, temperature 97.7 F (36.5 C), temperature source Oral, resp. rate 16, height 6' 0.25" (1.835 m), weight 233 lb 8 oz (105.915 kg), SpO2 97 %. Gen: Alert, well appearing.  Patient is oriented to person, place, time, and situation. AFFECT: pleasant, lucid thought and speech. ENT: Ears: EACs clear, normal epithelium.  TMs with good light reflex and landmarks bilaterally.  Eyes: no injection, icteris, swelling, or exudate.  EOMI, PERRLA. Nose: no drainage or turbinate edema/swelling.  No injection or focal lesion.  Mouth: lips without lesion/swelling.  Oral mucosa pink and moist.  Dentition intact and without obvious caries or gingival swelling.  Oropharynx without erythema, exudate, or swelling.  Neck: supple/nontender.  No LAD, mass, or TM.  Carotid pulses 2+ bilaterally, without bruits. CV: RRR, no m/r/g.   LUNGS: CTA bilat, nonlabored resps, good aeration in all lung fields. ABD: soft, NT, ND, BS normal.  No hepatospenomegaly or mass.  No bruits. EXT: no clubbing, cyanosis, or edema.  Musculoskeletal: no joint swelling, erythema, warmth, or tenderness.  ROM of all joints intact. Skin - no sores or suspicious lesions or rashes or color changes  Pertinent labs:  Lab Results  Component Value Date   TSH 2.28 06/06/2015   Lab Results  Component Value Date   WBC 5.9 06/06/2015   HGB 16.1 06/06/2015   HCT 48.5 06/06/2015   MCV 85.5 06/06/2015   PLT 185.0 06/06/2015   Lab Results  Component Value Date   CREATININE 0.97 06/06/2015   BUN 15  06/06/2015   NA 140 06/06/2015   K 4.3 06/06/2015   CL 103 06/06/2015   CO2 30 06/06/2015   Lab Results  Component Value Date   ALT 49 06/06/2015   AST 29 06/06/2015   ALKPHOS 75 06/06/2015   BILITOT 0.5 06/06/2015   Lab Results  Component Value Date   CHOL 164 06/06/2015   Lab Results  Component Value Date   HDL 37.60* 06/06/2015   Lab Results  Component Value Date   LDLCALC 95 06/06/2015   Lab Results  Component Value Date   TRIG 157.0* 06/06/2015   Lab Results  Component Value Date   CHOLHDL 4 06/06/2015   ASSESSMENT AND PLAN:   Health maintenance exam: Reviewed age and gender appropriate health maintenance issues (prudent diet, regular exercise, health risks of tobacco and excessive alcohol, use of seatbelts, fire alarms in home, use of sunscreen).  Also reviewed age and gender appropriate health screening as well as vaccine recommendations. He declined flu vaccine today as well as HIV screening. Reviewed HP labs with pt in detail today: minimal trig elev and minimally low HDL, o/w all excellent. Instructions:Goal exercise is to get HR to 160-170 range for 20-30 min 5 of 7 days per week. Shoot for a weight loss goal of 30 pounds over the next 1 year.  An After Visit Summary was printed and given to the patient.  FOLLOW UP:  Return in about 1 year (around 06/12/2016) for annual CPE (fasting).

## 2015-09-22 ENCOUNTER — Telehealth: Payer: Self-pay | Admitting: Family Medicine

## 2015-09-22 NOTE — Telephone Encounter (Signed)
Patient has a question about 2 medications. Please call.

## 2015-09-25 MED ORDER — FINASTERIDE 1 MG PO TABS: 1.0000 mg | ORAL_TABLET | Freq: Every day | ORAL | Status: DC

## 2015-09-25 MED ORDER — ALUMINUM CHLORIDE 20 % EX SOLN: CUTANEOUS | Status: DC

## 2015-09-25 NOTE — Telephone Encounter (Signed)
Drysol eRx'd per pt request.

## 2015-09-25 NOTE — Telephone Encounter (Addendum)
Patient is requesting a refill on Drysol. It was Rx'd on 12/16/14 ended on 03/29/15. Patient says he knows he will needed it again because it is getting warmer outside.

## 2015-09-27 ENCOUNTER — Telehealth: Payer: Self-pay

## 2015-09-27 NOTE — Telephone Encounter (Signed)
Pt requesting med for nausea and diarrhea. Symptoms started this morning. Advised patient would schedule OV for today. Pt said he just had taken Pepto Bismol will call back and schedule if he does not feel better by noon.

## 2015-09-27 NOTE — Telephone Encounter (Signed)
Noted. Let me know if he calls back.

## 2015-12-22 ENCOUNTER — Ambulatory Visit (INDEPENDENT_AMBULATORY_CARE_PROVIDER_SITE_OTHER): Payer: BLUE CROSS/BLUE SHIELD | Admitting: Family Medicine

## 2015-12-22 ENCOUNTER — Encounter: Payer: Self-pay | Admitting: Family Medicine

## 2015-12-22 VITALS — BP 126/77 | HR 58 | Temp 97.6°F | Resp 16 | Wt 229.8 lb

## 2015-12-22 DIAGNOSIS — H6692 Otitis media, unspecified, left ear: Secondary | ICD-10-CM

## 2015-12-22 MED ORDER — CARBAMIDE PEROXIDE 6.5 % OT SOLN: 5.0000 [drp] | Freq: Two times a day (BID) | OTIC | 0 refills | Status: DC

## 2015-12-22 MED ORDER — AMOXICILLIN-POT CLAVULANATE 875-125 MG PO TABS: 1.0000 | ORAL_TABLET | Freq: Two times a day (BID) | ORAL | 0 refills | Status: DC

## 2015-12-22 NOTE — Patient Instructions (Signed)

## 2015-12-22 NOTE — Progress Notes (Signed)
Pre visit review using our clinic review tool, if applicable. No additional management support is needed unless otherwise documented below in the visit note.     Gary Griffith , 19-May-1986, 30 y.o., male MRN: [id number] Patient Care Team    Relationship Specialty Notifications Start End  Gary Massed, MD PCP - General Family Medicine  06/09/14     CC: Bilateral ear pain  Subjective: Pt presents for an acute OV with complaints of bilateral ear pain (left> right) of 1 week duration.  Associated symptoms include left jaw area hurt last week, he took ibf and it was fine. This occurred after he [place] bo[place] and [place]. He denies fever, chills, nausea, vomit, tinnitus, or hearing loss. He states he has had to have his ears irrigated at least once a year for excessive cerumen impaction.  No Known Allergies Social History  Substance Use Topics  . Smoking status: Never Smoker  . Smokeless tobacco: Never Used  . Alcohol use 0.6 oz/week    1 Cans of beer per week     Comment: occasionally   Past Medical History:  Diagnosis Date  . ADD (attention deficit disorder) 10/22/2010  . Asthma   . Environmental allergies    environmental--trees, grass, dust: also various metals make his hands break out (claritin 10mg  qd helps this)  . Fatty liver 2012   noted on abd u/s.  AST and ALT mildly elevated 01/2013  . GERD (gastroesophageal reflux disease)   . Obesity (BMI 30.0-34.9)    Past Surgical History:  Procedure Laterality Date  . no surgery     Family History  Problem Relation Age of Onset  . Hypertension Father   . Diabetes Maternal Grandmother   . Heart disease Maternal Grandmother     MI, stents  . Diabetes Paternal Grandfather   . Cancer Neg Hx      Medication List       Accurate as of 12/22/15  2:00 PM. Always use your most recent med list.          aluminum chloride 20 % external solution Commonly known as:  DRYSOL Apply qhs, and gradually decrease to 1-2 applications per week  as sweating decreases   finasteride 1 MG tablet Commonly known as:  PROPECIA Take 1 tablet (1 mg total) by mouth daily.   loratadine 10 MG tablet Commonly known as:  CLARITIN Take 10 mg by mouth daily.   omeprazole 20 MG capsule Commonly known as:  PRILOSEC Take 20 mg by mouth daily.       No results found for this or any previous visit (from the past 24 hour(s)). No results found.   ROS: Negative, with the exception of above mentioned in HPI  Objective:  BP 126/77 (BP Location: Left Arm, Patient Position: Sitting, Cuff Size: Small)   Pulse (!) 58   Temp 97.6 F (36.4 C) (Oral)   Resp 16   Wt 229 lb 12 oz (104.2 kg)   SpO2 99%   BMI 30.94 kg/m  Body mass index is 30.94 kg/m. Gen: Afebrile. No acute distress. Nontoxic in appearance, well developed, well nourished. Pleasant Caucasian male. HENT: AT. [place]. Bilateral TM visualized , right tympanic membrane within normal limits, left tympanic membrane with yellow fluid collection and air fluid levels, mild erythema. Bilateral external auditory meatus with brown and white debris. MMM, no oral lesions. Bilateral nares without erythema or swelling. Throat without erythema or exudates. No cough or hoarseness present on exam. No tenderness to  palpation. Eyes:Pupils Equal Round Reactive to light, Extraocular movements intact,  Conjunctiva without redness, discharge or icterus. Neck/lymp/endocrine: Supple, no lymphadenopathy CV: RRR  Chest: CTAB, no wheeze or crackles. Skin: No rashes, purpura or petechiae.  Neuro: Normal gait. PERLA. EOMi. Alert. Oriented x3   Assessment/Plan: Gary Griffith is a 30 y.o. male present for acute OV for  1. Acute left otitis media, recurrence not specified, unspecified otitis media type - Patient has left otitis media, with yellow/puslike fluid collection behind the tympanic membrane, with mild erythema. - Lavage  today to flush out debris in ears. - Augmentin prescribed. - debroxl for persistent  cerumen impaction, although looks pretty good today, discussed with him to use this 1-2 times a month to help with his cerumen. - Follow-up in 2 weeks if symptoms are not resolved.   > 25 minutes spent with patient, >50% of time spent face to face counseling patient and coordinating care.    electronically signed by:  Gary Pacini, DO  [facility] [place]

## 2016-01-17 DIAGNOSIS — M84375A Stress fracture, left foot, initial encounter for fracture: Secondary | ICD-10-CM | POA: Diagnosis not present

## 2016-01-30 DIAGNOSIS — M84375D Stress fracture, left foot, subsequent encounter for fracture with routine healing: Secondary | ICD-10-CM | POA: Diagnosis not present

## 2016-02-13 ENCOUNTER — Telehealth: Payer: Self-pay

## 2016-02-13 ENCOUNTER — Other Ambulatory Visit: Payer: Self-pay | Admitting: Family Medicine

## 2016-02-13 DIAGNOSIS — M84375D Stress fracture, left foot, subsequent encounter for fracture with routine healing: Secondary | ICD-10-CM | POA: Diagnosis not present

## 2016-02-13 MED ORDER — SILDENAFIL CITRATE 50 MG PO TABS: ORAL_TABLET | ORAL | 3 refills | Status: DC

## 2016-02-13 NOTE — Progress Notes (Signed)
Viagra eRx'd per pt's request.

## 2016-02-13 NOTE — Telephone Encounter (Signed)
Patient would like to get a prescription for Viagra, He had a prescription last year and would like to get a new prescription.

## 2016-02-15 NOTE — Telephone Encounter (Signed)
Patient notified

## 2016-06-13 DIAGNOSIS — H5203 Hypermetropia, bilateral: Secondary | ICD-10-CM | POA: Diagnosis not present

## 2016-10-03 ENCOUNTER — Encounter: Payer: Self-pay | Admitting: Family Medicine

## 2016-10-03 ENCOUNTER — Other Ambulatory Visit: Payer: Self-pay | Admitting: *Deleted

## 2016-10-03 MED ORDER — FINASTERIDE 1 MG PO TABS: 1.0000 mg | ORAL_TABLET | Freq: Every day | ORAL | 5 refills | Status: DC

## 2016-10-03 NOTE — Telephone Encounter (Signed)
I have renewed his propecia for the next 6 mo. Pls ask him to make f/u appt or appt for CPE sometime before he runs out of the med in the next 6 mo.-thx

## 2016-10-03 NOTE — Telephone Encounter (Signed)
Pt advised and voiced understanding. Tried to schedule apt for CPE but pt was unable to determine when would be a good time for him to come in. Pt wanted to have labs done before apt but again could not determine when he could come in. Pt will call back. (Pt seemed upset that he couldn't come in for labs earlier than 8:00am.)

## 2016-10-03 NOTE — Telephone Encounter (Signed)
[facility].  RF request for finasteride LOV: 06/13/15 Next ov: None Last written: 09/25/15 #30 w/ 11RF  Please advise. Thanks.

## 2016-10-04 ENCOUNTER — Telehealth: Payer: Self-pay | Admitting: *Deleted

## 2016-10-04 MED ORDER — HYDROCORTISONE ACE-PRAMOXINE 1-1 % RE FOAM: 1.0000 | Freq: Two times a day (BID) | RECTAL | 1 refills | Status: DC

## 2016-10-04 NOTE — Telephone Encounter (Signed)
Pt LMOM on 10/04/16 at 8:03am stating that he has hemorrhoids and would like a refill for proctofoam. He stated that this was prescribed for him in the past and it worked really well. [facility] Please advise. Thanks.

## 2016-10-04 NOTE — Telephone Encounter (Signed)
Proctofoam eRx'd per pt request.

## 2016-10-04 NOTE — Telephone Encounter (Signed)
Pt advised and voiced understanding.   

## 2016-10-09 ENCOUNTER — Encounter: Payer: BLUE CROSS/BLUE SHIELD | Admitting: Family Medicine

## 2016-10-31 ENCOUNTER — Other Ambulatory Visit: Payer: BLUE CROSS/BLUE SHIELD

## 2016-11-06 ENCOUNTER — Encounter: Payer: BLUE CROSS/BLUE SHIELD | Admitting: Family Medicine

## 2016-11-06 ENCOUNTER — Telehealth: Payer: Self-pay | Admitting: Family Medicine

## 2016-11-06 MED ORDER — FINASTERIDE 1 MG PO TABS: 1.0000 mg | ORAL_TABLET | Freq: Every day | ORAL | 5 refills | Status: DC

## 2016-11-06 MED ORDER — SILDENAFIL CITRATE 20 MG PO TABS: ORAL_TABLET | ORAL | 3 refills | Status: DC

## 2016-11-06 NOTE — Telephone Encounter (Signed)
OK.  eRx'd as per pt request.

## 2016-11-06 NOTE — Telephone Encounter (Signed)
Patient will be taking 2 at one time. Can he get 30 pills?

## 2016-11-06 NOTE — Telephone Encounter (Signed)
Can patient get Rx sildenafil 20 MG? It's a lot cheaper. [facility] send to [facility] [address]. [facility] [address]

## 2016-11-06 NOTE — Telephone Encounter (Signed)
Please advise. Thanks.  

## 2016-11-06 NOTE — Telephone Encounter (Signed)
Pt advised and voiced understanding.    Pt also requested that a [facility] stated [facility]   Rx sent.

## 2016-12-18 ENCOUNTER — Encounter: Payer: Self-pay | Admitting: Family Medicine

## 2016-12-18 ENCOUNTER — Ambulatory Visit (INDEPENDENT_AMBULATORY_CARE_PROVIDER_SITE_OTHER): Payer: BLUE CROSS/BLUE SHIELD | Admitting: Family Medicine

## 2016-12-18 VITALS — BP 117/68 | HR 58 | Temp 98.3°F | Resp 16 | Ht 72.0 in | Wt 227.0 lb

## 2016-12-18 DIAGNOSIS — Z0000 Encounter for general adult medical examination without abnormal findings: Secondary | ICD-10-CM

## 2016-12-18 LAB — LIPID PANEL
Cholesterol: 161 mg/dL (ref 0–200)
HDL: 44.5 mg/dL (ref >39.00)
LDL Cholesterol: 86 mg/dL (ref 0–99)
NONHDL: 116.87
Total CHOL/HDL Ratio: 4
Triglycerides: 154 mg/dL — ABNORMAL HIGH (ref 0.0–149.0)
VLDL: 30.8 mg/dL (ref 0.0–40.0)

## 2016-12-18 LAB — CBC WITH DIFFERENTIAL/PLATELET
Basophils Absolute: 0.1 10*3/uL (ref 0.0–0.1)
Basophils Relative: 1.3 % (ref 0.0–3.0)
EOS PCT: 7.4 % — AB (ref 0.0–5.0)
Eosinophils Absolute: 0.4 10*3/uL (ref 0.0–0.7)
HCT: 46.9 % (ref 39.0–52.0)
Hemoglobin: 16 g/dL (ref 13.0–17.0)
LYMPHS ABS: 2.1 10*3/uL (ref 0.7–4.0)
Lymphocytes Relative: 37.7 % (ref 12.0–46.0)
MCHC: 34 g/dL (ref 30.0–36.0)
MCV: 85.5 fl (ref 78.0–100.0)
MONO ABS: 0.3 10*3/uL (ref 0.1–1.0)
Monocytes Relative: 6 % (ref 3.0–12.0)
NEUTROS PCT: 47.6 % (ref 43.0–77.0)
Neutro Abs: 2.7 10*3/uL (ref 1.4–7.7)
Platelets: 174 10*3/uL (ref 150.0–400.0)
RBC: 5.48 Mil/uL (ref 4.22–5.81)
RDW: 13.4 % (ref 11.5–15.5)
WBC: 5.6 10*3/uL (ref 4.0–10.5)

## 2016-12-18 LAB — COMPREHENSIVE METABOLIC PANEL
ALK PHOS: 64 U/L (ref 39–117)
ALT: 51 U/L (ref 0–53)
AST: 34 U/L (ref 0–37)
Albumin: 4.8 g/dL (ref 3.5–5.2)
BILIRUBIN TOTAL: 0.5 mg/dL (ref 0.2–1.2)
BUN: 15 mg/dL (ref 6–23)
CO2: 29 mEq/L (ref 19–32)
CREATININE: 0.87 mg/dL (ref 0.40–1.50)
Calcium: 9.6 mg/dL (ref 8.4–10.5)
Chloride: 104 mEq/L (ref 96–112)
GFR: 108.78 mL/min (ref >60.00)
GLUCOSE: 101 mg/dL — AB (ref 70–99)
Potassium: 4.4 mEq/L (ref 3.5–5.1)
SODIUM: 140 meq/L (ref 135–145)
TOTAL PROTEIN: 6.8 g/dL (ref 6.0–8.3)

## 2016-12-18 LAB — TSH: TSH: 1.89 u[IU]/mL (ref 0.35–4.50)

## 2016-12-18 NOTE — Progress Notes (Signed)
Office Note 12/18/2016  CC:  Chief Complaint  Patient presents with  . Annual Exam    Pt is fasting.     HPI:  Gary Griffith is a 31 y.o.  male who is annual health maintenance exam. Feeling good.  Exercise: active in yard, etc but no formal exercise regimen. Diet: trying to make better food choices and portion size limitation. Eye: yes, annually. Dental: preventatives UTD.  Has added otc minoxidil to med regimen b/c he is worried about his hair loss.    Past Medical History:  Diagnosis Date  . ADD (attention deficit disorder) 10/22/2010  . Asthma   . Environmental allergies    environmental--trees, grass, dust: also various metals make his hands break out (claritin 10mg  qd helps this)  . Fatty liver 2012   noted on abd u/s.  AST and ALT mildly elevated 01/2013  . GERD (gastroesophageal reflux disease)   . Male pattern baldness    finasteride 1mg  qd  . Obesity (BMI 30.0-34.9)     Past Surgical History:  Procedure Laterality Date  . no surgery      Family History  Problem Relation Age of Onset  . Hypertension Father   . Diabetes Maternal Grandmother   . Heart disease Maternal Grandmother        MI, stents  . Diabetes Paternal Grandfather   . Cancer Neg Hx     Social History   Social History  . Marital status: Married    Spouse name: N/A  . Number of children: 0  . Years of education: N/A   Occupational History  . Not on file.   Social History Main Topics  . Smoking status: Never Smoker  . Smokeless tobacco: Never Used  . Alcohol use 0.6 oz/week    1 Cans of beer per week     Comment: occasionally  . Drug use: No  . Sexual activity: Yes    Partners: Female   Other Topics Concern  . Not on file   Social History Narrative   Regular exercise: not currently, plans to restart gym 3 x weekly   Caffeine Use: 1 soda daily.   Has 1 daughter born 09/2011- Gary Griffith   Married.  [place] in [place].   Owns [employer]    Outpatient Medications Prior  to Visit  Medication Sig Dispense Refill  . aluminum chloride (DRYSOL) 20 % external solution Apply qhs, and gradually decrease to 1-2 applications per week as sweating decreases 35 mL 6  . finasteride (PROPECIA) 1 MG tablet Take 1 tablet (1 mg total) by mouth daily. 30 tablet 5  . sildenafil (REVATIO) 20 MG tablet 2 tabs po qd prn; take 30 min prior to intercourse 30 tablet 3  . amoxicillin-clavulanate (AUGMENTIN) 875-125 MG tablet Take 1 tablet by mouth 2 (two) times daily. (Patient not taking: Reported on 12/18/2016) 20 tablet 0  . carbamide peroxide (DEBROX) 6.5 % otic solution Place 5 drops into both ears 2 (two) times daily. (Patient not taking: Reported on 12/18/2016) 15 mL 0  . hydrocortisone-pramoxine (PROCTOFOAM-HC) rectal foam Place 1 applicator rectally 2 (two) times daily. (Patient not taking: Reported on 12/18/2016) 10 g 1  . loratadine (CLARITIN) 10 MG tablet Take 10 mg by mouth daily.    Marland Kitchen. omeprazole (PRILOSEC) 20 MG capsule Take 20 mg by mouth daily.    . sildenafil (VIAGRA) 50 MG tablet 1-2 tabs po qd prn (Patient not taking: Reported on 12/18/2016) 10 tablet 3   No facility-administered  medications prior to visit.     No Known Allergies  ROS Review of Systems  Constitutional: Negative for appetite change, chills, fatigue and fever.  HENT: Negative for congestion, dental problem, ear pain and sore throat.   Eyes: Negative for discharge, redness and visual disturbance.  Respiratory: Negative for cough, chest tightness, shortness of breath and wheezing.   Cardiovascular: Negative for chest pain, palpitations and leg swelling.  Gastrointestinal: Negative for abdominal pain, blood in stool, diarrhea, nausea and vomiting.  Genitourinary: Negative for difficulty urinating, dysuria, flank pain, frequency, hematuria and urgency.  Musculoskeletal: Negative for arthralgias, back pain, joint swelling, myalgias and neck stiffness.  Skin: Negative for pallor and rash.  Neurological: Negative  for dizziness, speech difficulty, weakness and headaches.  Hematological: Negative for adenopathy. Does not bruise/bleed easily.  Psychiatric/Behavioral: Negative for confusion and sleep disturbance. The patient is not nervous/anxious.     PE; Blood pressure 117/68, pulse (!) 58, temperature 98.3 F (36.8 C), temperature source Oral, resp. rate 16, height 6' (1.829 m), weight 227 lb (103 kg), SpO2 97 %. Body mass index is 30.79 kg/m.  Gen: Alert, well appearing.  Patient is oriented to person, place, time, and situation. AFFECT: pleasant, lucid thought and speech. ENT: Ears: EACs clear, normal epithelium.  TMs with good light reflex and landmarks bilaterally.  Eyes: no injection, icteris, swelling, or exudate.  EOMI, PERRLA. Nose: no drainage or turbinate edema/swelling.  No injection or focal lesion.  Mouth: lips without lesion/swelling.  Oral mucosa pink and moist.  Dentition intact and without obvious caries or gingival swelling.  Oropharynx without erythema, exudate, or swelling.  Neck: supple/nontender.  No LAD, mass, or TM.  Carotid pulses 2+ bilaterally, without bruits. CV: RRR, no m/r/g.   LUNGS: CTA bilat, nonlabored resps, good aeration in all lung fields. ABD: soft, NT, ND, BS normal.  No hepatospenomegaly or mass.  No bruits. EXT: no clubbing, cyanosis, or edema.  Musculoskeletal: no joint swelling, erythema, warmth, or tenderness.  ROM of all joints intact. Skin - no sores or suspicious lesions or rashes or color changes   Pertinent labs:  Lab Results  Component Value Date   TSH 2.28 06/06/2015   Lab Results  Component Value Date   WBC 5.9 06/06/2015   HGB 16.1 06/06/2015   HCT 48.5 06/06/2015   MCV 85.5 06/06/2015   PLT 185.0 06/06/2015   Lab Results  Component Value Date   CREATININE 0.97 06/06/2015   BUN 15 06/06/2015   NA 140 06/06/2015   K 4.3 06/06/2015   CL 103 06/06/2015   CO2 30 06/06/2015   Lab Results  Component Value Date   ALT 49 06/06/2015    AST 29 06/06/2015   ALKPHOS 75 06/06/2015   BILITOT 0.5 06/06/2015   Lab Results  Component Value Date   CHOL 164 06/06/2015   Lab Results  Component Value Date   HDL 37.60 (L) 06/06/2015   Lab Results  Component Value Date   LDLCALC 95 06/06/2015   Lab Results  Component Value Date   TRIG 157.0 (H) 06/06/2015   Lab Results  Component Value Date   CHOLHDL 4 06/06/2015    ASSESSMENT AND PLAN:   Health maintenance exam: Reviewed age and gender appropriate health maintenance issues (prudent diet, regular exercise, health risks of tobacco and excessive alcohol, use of seatbelts, fire alarms in home, use of sunscreen).  Also reviewed age and gender appropriate health screening as well as vaccine recommendations. Fasting HP labs today. Vaccines UTD.  He declined HIV screening today.  An After Visit Summary was printed and given to the patient.  FOLLOW UP:  Return in about 1 year (around 12/18/2017) for annual CPE (fasting).  Signed:  Santiago Bumpers, MD           12/18/2016

## 2016-12-18 NOTE — Patient Instructions (Signed)

## 2017-06-26 DIAGNOSIS — H5203 Hypermetropia, bilateral: Secondary | ICD-10-CM | POA: Diagnosis not present

## 2017-06-30 ENCOUNTER — Other Ambulatory Visit: Payer: Self-pay | Admitting: Family Medicine

## 2017-07-01 ENCOUNTER — Other Ambulatory Visit: Payer: Self-pay

## 2017-07-01 NOTE — Telephone Encounter (Signed)
Opened in error

## 2017-07-01 NOTE — Telephone Encounter (Signed)
[facility]  RF request for finasteride LOV: 12/18/16 Next ov: 12/23/17 Last written: 11/06/16 #30 w/ 5RF  Please advise. Thanks.

## 2017-07-21 ENCOUNTER — Encounter: Payer: Self-pay | Admitting: Family Medicine

## 2017-07-21 ENCOUNTER — Ambulatory Visit: Payer: BLUE CROSS/BLUE SHIELD | Admitting: Family Medicine

## 2017-07-21 VITALS — BP 125/84 | HR 65 | Temp 97.8°F | Ht 72.0 in | Wt 234.1 lb

## 2017-07-21 DIAGNOSIS — J0100 Acute maxillary sinusitis, unspecified: Secondary | ICD-10-CM | POA: Diagnosis not present

## 2017-07-21 MED ORDER — AMOXICILLIN-POT CLAVULANATE 875-125 MG PO TABS: 1.0000 | ORAL_TABLET | Freq: Two times a day (BID) | ORAL | 0 refills | Status: DC

## 2017-07-21 NOTE — Patient Instructions (Addendum)
Rest, hydrate.  + flonase, mucinex (DM if cough), nettie pot or nasal saline.  Start daily allergy medicine also (claritin, zyrtec, xyzal) Augmentin  prescribed, take until completed if started. Try to wait another 24 hours .Marland Kitchen.If these abnormal clinical findings persist, appropriate workup will be completed. The patient understands that follow up is required to elucidate the situation... If  Improving do not start.  If cough present it can last up to 6-8 weeks.  F/U 2 weeks of not improved.     Sinusitis, Adult Sinusitis is soreness and inflammation of your sinuses. Sinuses are hollow spaces in the bones around your face. They are located:  Around your eyes.  In the middle of your forehead.  Behind your nose.  In your cheekbones.  Your sinuses and nasal passages are lined with a stringy fluid (mucus). Mucus normally drains out of your sinuses. When your nasal tissues get inflamed or swollen, the mucus can get trapped or blocked so air cannot flow through your sinuses. This lets bacteria, viruses, and funguses grow, and that leads to infection. Follow these instructions at home: Medicines  Take, use, or apply over-the-counter and prescription medicines only as told by your doctor. These may include nasal sprays.  If you were prescribed an antibiotic medicine, take it as told by your doctor. Do not stop taking the antibiotic even if you start to feel better. Hydrate and Humidify  Drink enough water to keep your pee (urine) clear or pale yellow.  Use a cool mist humidifier to keep the humidity level in your home above 50%.  Breathe in steam for 10-15 minutes, 3-4 times a day or as told by your doctor. You can do this in the bathroom while a hot shower is running.  Try not to spend time in cool or dry air. Rest  Rest as much as possible.  Sleep with your head raised (elevated).  Make sure to get enough sleep each night. General instructions  Put a warm, moist washcloth on your  face 3-4 times a day or as told by your doctor. This will help with discomfort.  Wash your hands often with soap and water. If there is no soap and water, use hand sanitizer.  Do not smoke. Avoid being around people who are smoking (secondhand smoke).  Keep all follow-up visits as told by your doctor. This is important. Contact a doctor if:  You have a fever.  Your symptoms get worse.  Your symptoms do not get better within 10 days. Get help right away if:  You have a very bad headache.  You cannot stop throwing up (vomiting).  You have pain or swelling around your face or eyes.  You have trouble seeing.  You feel confused.  Your neck is stiff.  You have trouble breathing. This information is not intended to replace advice given to you by your health care provider. Make sure you discuss any questions you have with your health care provider. Document Released: 10/23/2007 Document Revised: 12/31/2015 Document Reviewed: 03/01/2015 Elsevier Interactive Patient Education  [employer]2018 [employer]

## 2017-07-21 NOTE — Progress Notes (Signed)
Gary Griffith , [birth year], 32 y.o., male MRN: [id number] Patient Care Team    Relationship Specialty Notifications Start End  McGowen, Maryjean Morn, MD PCP - General Family Medicine  06/09/14     Chief Complaint  Patient presents with  . URI    Pt c/o of sinus pressure, congestion, drainage,sorethroat and body ache X 4 days. Try mucinex D and Ibuprofen  for temporal relief     Subjective: Pt presents for an OV with complaints of sinus pain of 4 days duration.  Associated symptoms include sore throat, nasal congestion, fatigue, nasal drainage, watery eyes and decreased appetite. Patient reports he was exposed to a sick contact at work last week. He denies fever, chills, nausea, vomit, diarrhea or sneezing. Pt has tried vics, Mucinex DM and ibuprofen to ease their symptoms.   Depression screen PHQ 2/9 12/18/2016  Decreased Interest 0  Down, Depressed, Hopeless 0  PHQ - 2 Score 0    No Known Allergies Social History   Tobacco Use  . Smoking status: Never Smoker  . Smokeless tobacco: Never Used  Substance Use Topics  . Alcohol use: Yes    Alcohol/week: 0.6 oz    Types: 1 Cans of beer per week    Comment: occasionally   Past Medical History:  Diagnosis Date  . ADD (attention deficit disorder) 10/22/2010  . Asthma   . Environmental allergies    environmental--trees, grass, dust: also various metals make his hands break out (claritin 10mg  qd helps this)  . Fatty liver 2012   noted on abd u/s.  AST and ALT mildly elevated 01/2013  . GERD (gastroesophageal reflux disease)   . Male pattern baldness    finasteride 1mg  qd  . Obesity (BMI 30.0-34.9)    Past Surgical History:  Procedure Laterality Date  . no surgery     Family History  Problem Relation Age of Onset  . Hypertension Father   . Diabetes Maternal Grandmother   . Heart disease Maternal Grandmother        MI, stents  . Diabetes Paternal Grandfather   . Cancer Neg Hx    Allergies as of 07/21/2017   No Known  Allergies     Medication List        Accurate as of 07/21/17  4:16 PM. Always use your most recent med list.          aluminum chloride 20 % external solution Commonly known as:  DRYSOL Apply qhs, and gradually decrease to 1-2 applications per week as sweating decreases   finasteride 1 MG tablet Commonly known as:  PROPECIA TAKE 1 TABLET BY MOUTH DAILY   sildenafil 20 MG tablet Commonly known as:  REVATIO 2 tabs po qd prn; take 30 min prior to intercourse       All past medical history, surgical history, allergies, family history, immunizations andmedications were updated in the EMR today and reviewed under the history and medication portions of their EMR.     ROS: Negative, with the exception of above mentioned in HPI   Objective:  BP 125/84 (BP Location: Right Arm, Patient Position: Sitting, Cuff Size: Large)   Pulse 65   Temp 97.8 F (36.6 C) (Oral)   Ht 6' (1.829 m)   Wt 234 lb 1.9 oz (106.2 kg)   SpO2 95%   BMI 31.75 kg/m  Body mass index is 31.75 kg/m. Gen: Afebrile. No acute distress. Nontoxic in appearance, well developed, well nourished.  HENT: AT. [place]. Bilateral  TM visualized without erythema or bulging. MMM, no oral lesions. Bilateral nares with severe erythema, and nasal drainage. Throat without erythema or exudates. Postnasal drip present. Mild hoarseness present. No cough. Tender to palpation maxillary sinus. Eyes:Pupils Equal Round Reactive to light, Extraocular movements intact,  Conjunctiva without redness, discharge or icterus. Neck/lymp/endocrine: Supple, no lymphadenopathy CV: RRR Chest: CTAB, no wheeze or crackles. Good air movement, normal resp effort.  Abd: Soft. NTND. BS present. No Masses palpated.  Neuro:  Normal gait. PERLA. EOMi. Alert. Oriented x3  Psych: Normal affect, dress and demeanor. Normal speech. Normal thought content and judgment.  No exam data present No results found. No results found for this or any previous visit (from the  past 24 hour(s)).  Assessment/Plan: Gary Griffith is a 32 y.o. male present for OV for  Acute non-recurrent maxillary sinusitis Rest, hydrate.  + flonase, mucinex (DM if cough), nettie pot or nasal saline.  augmentin (printed) prescribed, take until completed if started. He will wait another 24-48 hours, if not improving start antibiotics. If cough present it can last up to 6-8 weeks.  F/U 2 weeks of not improved.     Reviewed expectations re: course of current medical issues.  Discussed self-management of symptoms.  Outlined signs and symptoms indicating need for more acute intervention.  Patient verbalized understanding and all questions were answered.  Patient received an After-Visit Summary.    No orders of the defined types were placed in this encounter.    Note is dictated utilizing voice recognition software. Although note has been proof read prior to signing, occasional typographical errors still can be missed. If any questions arise, please do not hesitate to call for verification.   electronically signed by:  Felix Pacinienee Kuneff, DO  [facility] - [place]

## 2017-08-22 ENCOUNTER — Other Ambulatory Visit: Payer: Self-pay

## 2017-08-22 MED ORDER — ALUMINUM CHLORIDE 20 % EX SOLN: CUTANEOUS | 6 refills | Status: DC

## 2017-11-27 ENCOUNTER — Other Ambulatory Visit: Payer: Self-pay | Admitting: Family Medicine

## 2017-11-27 MED ORDER — FINASTERIDE 1 MG PO TABS: 1.0000 mg | ORAL_TABLET | Freq: Every day | ORAL | 0 refills | Status: DC

## 2017-12-23 ENCOUNTER — Ambulatory Visit (INDEPENDENT_AMBULATORY_CARE_PROVIDER_SITE_OTHER): Payer: BLUE CROSS/BLUE SHIELD | Admitting: Family Medicine

## 2017-12-23 ENCOUNTER — Encounter: Payer: Self-pay | Admitting: Family Medicine

## 2017-12-23 VITALS — BP 115/73 | HR 50 | Temp 98.2°F | Resp 16 | Ht 72.5 in | Wt 233.5 lb

## 2017-12-23 DIAGNOSIS — E669 Obesity, unspecified: Secondary | ICD-10-CM | POA: Diagnosis not present

## 2017-12-23 DIAGNOSIS — Z0000 Encounter for general adult medical examination without abnormal findings: Secondary | ICD-10-CM | POA: Diagnosis not present

## 2017-12-23 LAB — LIPID PANEL
CHOL/HDL RATIO: 4
Cholesterol: 176 mg/dL (ref 0–200)
HDL: 43.6 mg/dL (ref >39.00)
LDL CALC: 108 mg/dL — AB (ref 0–99)
NONHDL: 132.22
Triglycerides: 122 mg/dL (ref 0.0–149.0)
VLDL: 24.4 mg/dL (ref 0.0–40.0)

## 2017-12-23 LAB — CBC WITH DIFFERENTIAL/PLATELET
BASOS ABS: 0 10*3/uL (ref 0.0–0.1)
Basophils Relative: 0.8 % (ref 0.0–3.0)
Eosinophils Absolute: 0.3 10*3/uL (ref 0.0–0.7)
Eosinophils Relative: 5.3 % — ABNORMAL HIGH (ref 0.0–5.0)
HEMATOCRIT: 46.4 % (ref 39.0–52.0)
HEMOGLOBIN: 16.3 g/dL (ref 13.0–17.0)
LYMPHS PCT: 37.4 % (ref 12.0–46.0)
Lymphs Abs: 2.1 10*3/uL (ref 0.7–4.0)
MCHC: 35.1 g/dL (ref 30.0–36.0)
MCV: 84.5 fl (ref 78.0–100.0)
Monocytes Absolute: 0.3 10*3/uL (ref 0.1–1.0)
Monocytes Relative: 5.6 % (ref 3.0–12.0)
NEUTROS ABS: 2.9 10*3/uL (ref 1.4–7.7)
Neutrophils Relative %: 50.9 % (ref 43.0–77.0)
PLATELETS: 201 10*3/uL (ref 150.0–400.0)
RBC: 5.48 Mil/uL (ref 4.22–5.81)
RDW: 13.1 % (ref 11.5–15.5)
WBC: 5.7 10*3/uL (ref 4.0–10.5)

## 2017-12-23 LAB — COMPREHENSIVE METABOLIC PANEL
ALT: 88 U/L — ABNORMAL HIGH (ref 0–53)
AST: 38 U/L — AB (ref 0–37)
Albumin: 5 g/dL (ref 3.5–5.2)
Alkaline Phosphatase: 63 U/L (ref 39–117)
BUN: 14 mg/dL (ref 6–23)
CHLORIDE: 103 meq/L (ref 96–112)
CO2: 29 mEq/L (ref 19–32)
CREATININE: 0.96 mg/dL (ref 0.40–1.50)
Calcium: 10.2 mg/dL (ref 8.4–10.5)
GFR: 96.47 mL/min (ref >60.00)
GLUCOSE: 105 mg/dL — AB (ref 70–99)
Potassium: 4.5 mEq/L (ref 3.5–5.1)
SODIUM: 139 meq/L (ref 135–145)
Total Bilirubin: 0.6 mg/dL (ref 0.2–1.2)
Total Protein: 7.2 g/dL (ref 6.0–8.3)

## 2017-12-23 LAB — TSH: TSH: 1.67 u[IU]/mL (ref 0.35–4.50)

## 2017-12-23 MED ORDER — FINASTERIDE 1 MG PO TABS: 1.0000 mg | ORAL_TABLET | Freq: Every day | ORAL | 3 refills | Status: DC

## 2017-12-23 MED ORDER — SILDENAFIL CITRATE 20 MG PO TABS
ORAL_TABLET | ORAL | 3 refills | Status: DC
Start: 2017-12-23 — End: 2018-05-27

## 2017-12-23 MED ORDER — ALUMINUM CHLORIDE 20 % EX SOLN: CUTANEOUS | 6 refills | Status: DC

## 2017-12-23 NOTE — Patient Instructions (Signed)

## 2017-12-23 NOTE — Progress Notes (Signed)
Office Note 12/23/2017  CC:  Chief Complaint  Patient presents with  . Annual Exam    Pt is fasting.    HPI:  Gary Griffith is a 32 y.o. White male who is here for annual health maintenance exam.  Exercise: none in the last 8 months or so.  Active in yard.  Not sedentary. Diet: trying to make small, smart decisions.  Feeling well.   Past Medical History:  Diagnosis Date  . ADD (attention deficit disorder) 10/22/2010  . Asthma   . Environmental allergies    environmental--trees, grass, dust: also various metals make his hands break out (claritin 10mg  qd helps this)  . Fatty liver 2012   noted on abd u/s.  AST and ALT mildly elevated 01/2013  . GERD (gastroesophageal reflux disease)   . Male pattern baldness    finasteride 1mg  qd  . Obesity (BMI 30.0-34.9)     Past Surgical History:  Procedure Laterality Date  . no surgery      Family History  Problem Relation Age of Onset  . Hypertension Father   . Diabetes Maternal Grandmother   . Heart disease Maternal Grandmother        MI, stents  . Diabetes Paternal Grandfather   . Cancer Neg Hx     Social History   Socioeconomic History  . Marital status: Married    Spouse name: Not on file  . Number of children: 0  . Years of education: Not on file  . Highest education level: Not on file  Occupational History  . Not on file  Social Needs  . Financial resource strain: Not on file  . Food insecurity:    Worry: Not on file    Inability: Not on file  . Transportation needs:    Medical: Not on file    Non-medical: Not on file  Tobacco Use  . Smoking status: Never Smoker  . Smokeless tobacco: Never Used  Substance and Sexual Activity  . Alcohol use: Yes    Alcohol/week: 0.6 oz    Types: 1 Cans of beer per week    Comment: occasionally  . Drug use: No  . Sexual activity: Yes    Partners: Female  Lifestyle  . Physical activity:    Days per week: Not on file    Minutes per session: Not on file  . Stress:  Not on file  Relationships  . Social connections:    Talks on phone: Not on file    Gets together: Not on file    Attends religious service: Not on file    Active member of club or organization: Not on file    Attends meetings of clubs or organizations: Not on file    Relationship status: Not on file  . Intimate partner violence:    Fear of current or ex partner: Not on file    Emotionally abused: Not on file    Physically abused: Not on file    Forced sexual activity: Not on file  Other Topics Concern  . Not on file  Social History Narrative   Regular exercise: not currently (as ov 11/2016).   Caffeine Use: 1 soda daily.   Has 1 daughter, [name] starting kindergarten as of Fall 2018.   Married.  Lives in [place].   Owns [employer]    Outpatient Medications Prior to Visit  Medication Sig Dispense Refill  . finasteride (PROPECIA) 1 MG tablet Take 1 tablet (1 mg total) by mouth daily.  60 tablet 0  . sildenafil (REVATIO) 20 MG tablet 2 tabs po qd prn; take 30 min prior to intercourse 30 tablet 3  . aluminum chloride (DRYSOL) 20 % external solution Apply qhs, and gradually decrease to 1-2 applications per week as sweating decreases (Patient not taking: Reported on 12/23/2017) 35 mL 6  . amoxicillin-clavulanate (AUGMENTIN) 875-125 MG tablet Take 1 tablet by mouth 2 (two) times daily. (Patient not taking: Reported on 12/23/2017) 20 tablet 0   No facility-administered medications prior to visit.     No Known Allergies  ROS Review of Systems  Constitutional: Negative for appetite change, chills, fatigue and fever.  HENT: Negative for congestion, dental problem, ear pain and sore throat.   Eyes: Negative for discharge, redness and visual disturbance.  Respiratory: Negative for cough, chest tightness, shortness of breath and wheezing.   Cardiovascular: Negative for chest pain, palpitations and leg swelling.  Gastrointestinal: Negative for abdominal pain, blood in stool, diarrhea,  nausea and vomiting.  Genitourinary: Negative for difficulty urinating, dysuria, flank pain, frequency, hematuria and urgency.  Musculoskeletal: Negative for arthralgias, back pain, joint swelling, myalgias and neck stiffness.  Skin: Negative for pallor and rash.  Neurological: Negative for dizziness, speech difficulty, weakness and headaches.  Hematological: Negative for adenopathy. Does not bruise/bleed easily.  Psychiatric/Behavioral: Negative for confusion and sleep disturbance. The patient is not nervous/anxious.     PE; Blood pressure 115/73, pulse (!) 50, temperature 98.2 F (36.8 C), temperature source Oral, resp. rate 16, height 6' 0.5" (1.842 m), weight 233 lb 8 oz (105.9 kg), SpO2 97 %. Gen: Alert, well appearing.  Patient is oriented to person, place, time, and situation. AFFECT: pleasant, lucid thought and speech. ENT: Ears: EACs clear, normal epithelium.  TMs with good light reflex and landmarks bilaterally.  Eyes: no injection, icteris, swelling, or exudate.  EOMI, PERRLA. Nose: no drainage or turbinate edema/swelling.  No injection or focal lesion.  Mouth: lips without lesion/swelling.  Oral mucosa pink and moist.  Dentition intact and without obvious caries or gingival swelling.  Oropharynx without erythema, exudate, or swelling.  Neck: supple/nontender.  No LAD, mass, or TM.  Carotid pulses 2+ bilaterally, without bruits. CV: RRR, no m/r/g.   LUNGS: CTA bilat, nonlabored resps, good aeration in all lung fields. ABD: soft, NT, ND, BS normal.  No hepatospenomegaly or mass.  No bruits. EXT: no clubbing, cyanosis, or edema.  Musculoskeletal: no joint swelling, erythema, warmth, or tenderness.  ROM of all joints intact. Skin - no sores or suspicious lesions or rashes or color changes   Pertinent labs:  Lab Results  Component Value Date   TSH 1.89 12/18/2016   Lab Results  Component Value Date   WBC 5.6 12/18/2016   HGB 16.0 12/18/2016   HCT 46.9 12/18/2016   MCV 85.5  12/18/2016   PLT 174.0 12/18/2016   Lab Results  Component Value Date   CREATININE 0.87 12/18/2016   BUN 15 12/18/2016   NA 140 12/18/2016   K 4.4 12/18/2016   CL 104 12/18/2016   CO2 29 12/18/2016   Lab Results  Component Value Date   ALT 51 12/18/2016   AST 34 12/18/2016   ALKPHOS 64 12/18/2016   BILITOT 0.5 12/18/2016   Lab Results  Component Value Date   CHOL 161 12/18/2016   Lab Results  Component Value Date   HDL 44.50 12/18/2016   Lab Results  Component Value Date   LDLCALC 86 12/18/2016   Lab Results  Component Value Date  TRIG 154.0 (H) 12/18/2016   Lab Results  Component Value Date   CHOLHDL 4 12/18/2016     ASSESSMENT AND PLAN:   Health maintenance exam: Reviewed age and gender appropriate health maintenance issues (prudent diet, regular exercise, health risks of tobacco and excessive alcohol, use of seatbelts, fire alarms in home, use of sunscreen).  Also reviewed age and gender appropriate health screening as well as vaccine recommendations. Vaccines: UTD Labs: fasting HP labs. Prostate ca screening: average risk patient= start screening at age 65 yrs. Colon ca screening: average risk patient= start screening at age 36 yrs.  RF's on drysol, propecia, and sildenafil done today.  An After Visit Summary was printed and given to the patient.  FOLLOW UP:  Return in about 1 year (around 12/24/2018) for annual CPE (fasting).  Signed:  Santiago Bumpers, MD           12/23/2017

## 2017-12-24 ENCOUNTER — Other Ambulatory Visit (INDEPENDENT_AMBULATORY_CARE_PROVIDER_SITE_OTHER): Payer: BLUE CROSS/BLUE SHIELD

## 2017-12-24 ENCOUNTER — Other Ambulatory Visit: Payer: Self-pay | Admitting: Family Medicine

## 2017-12-24 DIAGNOSIS — R7301 Impaired fasting glucose: Secondary | ICD-10-CM

## 2017-12-24 DIAGNOSIS — R740 Nonspecific elevation of levels of transaminase and lactic acid dehydrogenase [LDH]: Secondary | ICD-10-CM | POA: Diagnosis not present

## 2017-12-24 DIAGNOSIS — R7401 Elevation of levels of liver transaminase levels: Secondary | ICD-10-CM

## 2017-12-24 LAB — HEMOGLOBIN A1C: Hgb A1c MFr Bld: 5.2 % (ref 4.6–6.5)

## 2017-12-27 LAB — HEPATITIS B SURFACE ANTIGEN: Hepatitis B Surface Ag: NONREACTIVE

## 2017-12-27 LAB — TEST AUTHORIZATION

## 2017-12-27 LAB — HEPATITIS C ANTIBODY
Hepatitis C Ab: NONREACTIVE
SIGNAL TO CUT-OFF: 0.01 (ref <1.00)

## 2017-12-27 LAB — HEPATITIS B SURFACE ANTIBODY, QUANTITATIVE: Hepatitis B-Post: 56 m[IU]/mL (ref >=10)

## 2017-12-29 ENCOUNTER — Encounter: Payer: Self-pay | Admitting: Family Medicine

## 2018-01-29 DIAGNOSIS — Z3009 Encounter for other general counseling and advice on contraception: Secondary | ICD-10-CM | POA: Diagnosis not present

## 2018-03-06 DIAGNOSIS — Z302 Encounter for sterilization: Secondary | ICD-10-CM | POA: Diagnosis not present

## 2018-03-18 ENCOUNTER — Encounter: Payer: Self-pay | Admitting: Family Medicine

## 2018-03-18 ENCOUNTER — Ambulatory Visit: Payer: BLUE CROSS/BLUE SHIELD | Admitting: Family Medicine

## 2018-03-18 VITALS — BP 129/78 | HR 56 | Temp 98.1°F | Resp 16 | Ht 73.0 in | Wt 239.0 lb

## 2018-03-18 DIAGNOSIS — H60392 Other infective otitis externa, left ear: Secondary | ICD-10-CM

## 2018-03-18 MED ORDER — NEOMYCIN-POLYMYXIN-HC 3.5-10000-1 OT SOLN
OTIC | 0 refills | Status: DC
Start: 2018-03-18 — End: 2018-03-25

## 2018-03-18 NOTE — Progress Notes (Signed)
OFFICE VISIT  03/18/2018   CC:  Chief Complaint  Patient presents with  . Ear Pain    Left ear pain   HPI:    Patient is a 32 y.o.  male who presents for left ear pain. Onset 3 days ago.  Constant and worsening over time. No fever.  Hurts around L TMJ area too. No hearing deficit but says L EAC feels narrower, esp when he puts a Q tip in EAC. No drainage out of ear.  No URI/congestion lately. Ibup and peroxide no help. No recent altitude/airplane flight.  Past Medical History:  Diagnosis Date  . ADD (attention deficit disorder) 10/22/2010  . Asthma   . Environmental allergies    environmental--trees, grass, dust: also various metals make his hands break out (claritin 10mg  qd helps this)  . Fatty liver 2012   noted on abd u/s.  AST and ALT mildly elevated 01/2013 and 12/2017.  Viral Hep NEG 12/2017.  Marland Kitchen GERD (gastroesophageal reflux disease)   . Male pattern baldness    finasteride 1mg  qd  . Obesity (BMI 30.0-34.9)     Past Surgical History:  Procedure Laterality Date  . no surgery    . VASECTOMY      Outpatient Medications Prior to Visit  Medication Sig Dispense Refill  . aluminum chloride (DRYSOL) 20 % external solution Apply qhs, and gradually decrease to 1-2 applications per week as sweating decreases 35 mL 6  . finasteride (PROPECIA) 1 MG tablet Take 1 tablet (1 mg total) by mouth daily. 90 tablet 3  . loratadine (CLARITIN) 10 MG tablet Take 10 mg by mouth daily.    . sildenafil (REVATIO) 20 MG tablet 2 tabs po qd prn; take 30 min prior to intercourse 30 tablet 3   No facility-administered medications prior to visit.     No Known Allergies  ROS As per HPI  PE: Blood pressure 129/78, pulse (!) 56, temperature 98.1 F (36.7 C), temperature source Oral, resp. rate 16, height 6\' 1"  (1.854 m), weight 239 lb (108.4 kg), SpO2 98 %. Gen: Alert, well appearing.  Patient is oriented to person, place, time, and situation. AFFECT: pleasant, lucid thought and speech. R ear  EAC clear, TM normal.   L ear EAC with mild diffuse edema and erythema.  No exudate.  TM appears normal. No tenderness of external ear anatomy.  Mild TTP in area anterior to L ear, inferior to TMJ. No neck tenderness or swelling or erythema or nodule.  LABS:    Chemistry      Component Value Date/Time   NA 139 12/23/2017 0931   K 4.5 12/23/2017 0931   CL 103 12/23/2017 0931   CO2 29 12/23/2017 0931   BUN 14 12/23/2017 0931   CREATININE 0.96 12/23/2017 0931   CREATININE 0.77 02/03/2013 0816      Component Value Date/Time   CALCIUM 10.2 12/23/2017 0931   ALKPHOS 63 12/23/2017 0931   AST 38 (H) 12/23/2017 0931   ALT 88 (H) 12/23/2017 0931   BILITOT 0.6 12/23/2017 0931      IMPRESSION AND PLAN:  Left acute otitis externa: cortisporin otic drops, 4 drops in L ear tid x 7d.  An After Visit Summary was printed and given to the patient.  FOLLOW UP: Return if symptoms worsen or fail to improve.  Signed:  Santiago Bumpers, MD           03/18/2018

## 2018-03-19 ENCOUNTER — Telehealth: Payer: Self-pay | Admitting: Family Medicine

## 2018-03-19 NOTE — Telephone Encounter (Signed)
Reassure him: he just started the ear drops yesterday and they take a little time to help. Continue to take ibuprofen as needed.  Nothing further can be done at this time.-thx

## 2018-03-19 NOTE — Telephone Encounter (Signed)
Yes, that's correct---should take with a little food on his stomach, too.-thx

## 2018-03-19 NOTE — Telephone Encounter (Signed)
Patient was given Dr. Samul Dada message.  He stateed he is taking 800mg  of Ibuprofen every 6 hrs.  He wants to know if this is how he should be taking it.

## 2018-03-19 NOTE — Telephone Encounter (Signed)
Please advise. Thanks.  

## 2018-03-19 NOTE — Telephone Encounter (Signed)
Copied from CRM [id number]. Topic: Quick Communication - See Telephone Encounter >> Mar 19, 2018 10:50 AM Lorrine Kin, NT wrote: CRM for notification. See Telephone encounter for: 03/19/18. Patient calling and states that his ear is still bothering him. States that if he doesn't take 800mg  of ibuprofen, his ear is throbbing. Patient would like to know what else can be done. Please advise.

## 2018-03-20 NOTE — Telephone Encounter (Signed)
Spoke with patient reviewed instructions patient verbalized understanding. 

## 2018-03-25 ENCOUNTER — Encounter: Payer: Self-pay | Admitting: Family Medicine

## 2018-03-25 ENCOUNTER — Ambulatory Visit: Payer: BLUE CROSS/BLUE SHIELD | Admitting: Family Medicine

## 2018-03-25 VITALS — BP 112/67 | HR 54 | Temp 97.8°F | Resp 16 | Ht 73.0 in | Wt 239.4 lb

## 2018-03-25 DIAGNOSIS — B369 Superficial mycosis, unspecified: Secondary | ICD-10-CM

## 2018-03-25 DIAGNOSIS — H6240 Otitis externa in other diseases classified elsewhere, unspecified ear: Secondary | ICD-10-CM

## 2018-03-25 DIAGNOSIS — Z23 Encounter for immunization: Secondary | ICD-10-CM | POA: Diagnosis not present

## 2018-03-25 MED ORDER — CLOTRIMAZOLE 1 % EX SOLN: 1.0000 "application " | Freq: Two times a day (BID) | CUTANEOUS | 1 refills | Status: DC

## 2018-03-25 NOTE — Progress Notes (Signed)
OFFICE VISIT  03/25/2018   CC:  Chief Complaint  Patient presents with  . Ear Problem   HPI:    Patient is a 32 y.o. Caucasian male who presents for left ear feeling stopped up. I saw him 7 days ago for left ear pain and dx'd him with acute otitis externa and rx'd cortisporin otic drops x 7d. He has no more pain.  Last time he used the drops was yesterday.  He tried some peroxide, hot water, Q tip--still sounds stopped up.  No nasal congestion/cold sx's. No redness or swelling of ear, no fevers.  Past Medical History:  Diagnosis Date  . ADD (attention deficit disorder) 10/22/2010  . Asthma   . Environmental allergies    environmental--trees, grass, dust: also various metals make his hands break out (claritin 10mg  qd helps this)  . Fatty liver 2012   noted on abd u/s.  AST and ALT mildly elevated 01/2013 and 12/2017.  Viral Hep NEG 12/2017.  Marland Kitchen GERD (gastroesophageal reflux disease)   . Male pattern baldness    finasteride 1mg  qd  . Obesity (BMI 30.0-34.9)     Past Surgical History:  Procedure Laterality Date  . no surgery    . VASECTOMY      Outpatient Medications Prior to Visit  Medication Sig Dispense Refill  . aluminum chloride (DRYSOL) 20 % external solution Apply qhs, and gradually decrease to 1-2 applications per week as sweating decreases 35 mL 6  . finasteride (PROPECIA) 1 MG tablet Take 1 tablet (1 mg total) by mouth daily. 90 tablet 3  . loratadine (CLARITIN) 10 MG tablet Take 10 mg by mouth daily.    . sildenafil (REVATIO) 20 MG tablet 2 tabs po qd prn; take 30 min prior to intercourse 30 tablet 3  . neomycin-polymyxin-hydrocortisone (CORTISPORIN) OTIC solution 4 gtts in L ear tid x 7d (Patient not taking: Reported on 03/25/2018) 10 mL 0   No facility-administered medications prior to visit.     No Known Allergies  ROS As per HPI  PE: Blood pressure 112/67, pulse (!) 54, temperature 97.8 F (36.6 C), temperature source Oral, resp. rate 16, height 6\' 1"  (1.854  m), weight 239 lb 6 oz (108.6 kg), SpO2 97 %. Gen: Alert, well appearing.  Patient is oriented to person, place, time, and situation. AFFECT: pleasant, lucid thought and speech. R ear EAC clear, normal epithelium, normal TM. L EAC with scattered small pieces of white debris lining a mildly erythematous epithelium.  There is no EAC obstruction at all.  His TM is dull due to a film of this debris and mild erythema covering it.  LABS:  none  IMPRESSION AND PLAN:  Left fungal otitis externa. Clotrimazole 1% external solution, 3 drops in left ear bid x 10d.  An After Visit Summary was printed and given to the patient.  FOLLOW UP: Return if symptoms worsen or fail to improve.  Signed:  Santiago Bumpers, MD           03/25/2018

## 2018-03-27 ENCOUNTER — Other Ambulatory Visit: Payer: Self-pay | Admitting: Family Medicine

## 2018-03-27 DIAGNOSIS — B369 Superficial mycosis, unspecified: Secondary | ICD-10-CM

## 2018-03-27 DIAGNOSIS — H6240 Otitis externa in other diseases classified elsewhere, unspecified ear: Principal | ICD-10-CM

## 2018-03-27 NOTE — Telephone Encounter (Signed)
Next step: continue drops over the weekend.  I'll put in order for referral to ENT.  If he starts feeling improved before ENT appt then he can cancel it.  If it WORSENS over the weekend he should come back in for me to recheck it.

## 2018-03-27 NOTE — Telephone Encounter (Signed)
Patient notified and verbalized understanding. 

## 2018-03-27 NOTE — Telephone Encounter (Signed)
Patient calling and states that he has been using the athletes foot drops for 2 days and has not seen any improvement. States that he can hear out of his ear, but it's not all the way clear yet. Has not noticed any change since he was seen on 03/25/18. Would like to know what his next steps are or what he should do. Please advise.  CB#: [phone]

## 2018-03-30 ENCOUNTER — Encounter: Payer: Self-pay | Admitting: Family Medicine

## 2018-03-30 ENCOUNTER — Ambulatory Visit: Payer: BLUE CROSS/BLUE SHIELD | Admitting: Family Medicine

## 2018-03-30 VITALS — BP 109/67 | HR 47 | Temp 98.1°F | Resp 16 | Ht 73.0 in | Wt 240.0 lb

## 2018-03-30 DIAGNOSIS — L918 Other hypertrophic disorders of the skin: Secondary | ICD-10-CM | POA: Diagnosis not present

## 2018-03-30 DIAGNOSIS — B369 Superficial mycosis, unspecified: Secondary | ICD-10-CM

## 2018-03-30 DIAGNOSIS — H6242 Otitis externa in other diseases classified elsewhere, left ear: Secondary | ICD-10-CM

## 2018-03-30 NOTE — Progress Notes (Signed)
OFFICE VISIT  03/30/2018   CC:  Chief Complaint  Patient presents with  . Procedure    remove skin tag    HPI:    Patient is a 32 y.o. Caucasian male who presents for skin lesion on upper aspect of left leg. Of note, left ear is improving last few days but he has not used the drops in 2d, says he feels like he needs to restart drops.  No pain, just feels "closed up, full".  Past Medical History:  Diagnosis Date  . ADD (attention deficit disorder) 10/22/2010  . Asthma   . Environmental allergies    environmental--trees, grass, dust: also various metals make his hands break out (claritin 10mg  qd helps this)  . Fatty liver 2012   noted on abd u/s.  AST and ALT mildly elevated 01/2013 and 12/2017.  Viral Hep NEG 12/2017.  Marland Kitchen GERD (gastroesophageal reflux disease)   . Male pattern baldness    finasteride 1mg  qd  . Obesity (BMI 30.0-34.9)    .sochx Past Surgical History:  Procedure Laterality Date  . no surgery    . VASECTOMY      Outpatient Medications Prior to Visit  Medication Sig Dispense Refill  . aluminum chloride (DRYSOL) 20 % external solution Apply qhs, and gradually decrease to 1-2 applications per week as sweating decreases 35 mL 6  . finasteride (PROPECIA) 1 MG tablet Take 1 tablet (1 mg total) by mouth daily. 90 tablet 3  . loratadine (CLARITIN) 10 MG tablet Take 10 mg by mouth daily.    . sildenafil (REVATIO) 20 MG tablet 2 tabs po qd prn; take 30 min prior to intercourse 30 tablet 3  . clotrimazole (LOTRIMIN) 1 % external solution Apply 1 application topically 2 (two) times daily. 3 drops in left ear bid x 10 days (Patient not taking: Reported on 03/30/2018) 15 mL 1   No facility-administered medications prior to visit.     No Known Allergies  ROS As per HPI  PE: Blood pressure 109/67, pulse (!) 47, temperature 98.1 F (36.7 C), temperature source Oral, resp. rate 16, height 6\' 1"  (1.854 m), weight 240 lb (108.9 kg), SpO2 98 %. Gen: Alert, well appearing.   Patient is oriented to person, place, time, and situation. AFFECT: pleasant, lucid thought and speech. Left EAC with diffuse erythema but no swelling or exudate.  TM a bit dull but no fluid behind TM, TM is intact. No debris in EAC.   Left inner thigh with 1 cm skin tag with a base that is about 3 mm wide.  No erythema.   LABS:  none  IMPRESSION AND PLAN:  1) Skin tag x 1, left inner thigh: removed today with scalpal and pickups after applying alcohol and betadine. No local anesthesia required.  Pt tolerated procedure well, no immediate complications.  No blood loss.  Band aid applied.  2) Left ear fungal OM: continue cotrimazole 1% sol'n as drops into ear bid, treat for at least 14 consecutive days.  An After Visit Summary was printed and given to the patient.  FOLLOW UP: Return if symptoms worsen or fail to improve.  Signed:  Santiago Bumpers, MD           03/30/2018

## 2018-05-27 ENCOUNTER — Other Ambulatory Visit: Payer: Self-pay | Admitting: *Deleted

## 2018-05-27 MED ORDER — SILDENAFIL CITRATE 20 MG PO TABS: ORAL_TABLET | ORAL | 3 refills | Status: DC

## 2018-07-24 ENCOUNTER — Ambulatory Visit: Payer: BLUE CROSS/BLUE SHIELD | Admitting: Family Medicine

## 2018-07-24 ENCOUNTER — Encounter: Payer: Self-pay | Admitting: Family Medicine

## 2018-07-24 VITALS — BP 124/72 | HR 48 | Temp 97.8°F | Resp 16 | Ht 73.0 in | Wt 237.2 lb

## 2018-07-24 DIAGNOSIS — K219 Gastro-esophageal reflux disease without esophagitis: Secondary | ICD-10-CM | POA: Diagnosis not present

## 2018-07-24 DIAGNOSIS — R0989 Other specified symptoms and signs involving the circulatory and respiratory systems: Secondary | ICD-10-CM | POA: Diagnosis not present

## 2018-07-24 DIAGNOSIS — R198 Other specified symptoms and signs involving the digestive system and abdomen: Secondary | ICD-10-CM

## 2018-07-24 MED ORDER — OMEPRAZOLE 40 MG PO CPDR: DELAYED_RELEASE_CAPSULE | ORAL | 0 refills | Status: DC

## 2018-07-24 NOTE — Progress Notes (Signed)
OFFICE VISIT  07/24/2018   CC:  Chief Complaint  Patient presents with  . lump in throat    feels swollen x1 month   HPI:    Patient is a 33 y.o. Caucasian male who presents for "lump in throat". Points to area just above sternal notch. Used to happen only when he ate a big meal---onset months ago. The last month it has been constant, not just after meals.  Feels like something is stuck there. Denies any classic heartburn sx's.  Not on any PPI.  He used to take pepto, 2 tums and this would eliminate the sensation.   No clearing of throat, no morning ST, no dry cough.   Past Medical History:  Diagnosis Date  . ADD (attention deficit disorder) 10/22/2010  . Asthma   . Environmental allergies    environmental--trees, grass, dust: also various metals make his hands break out (claritin 10mg  qd helps this)  . Fatty liver 2012   noted on abd u/s.  AST and ALT mildly elevated 01/2013 and 12/2017.  Viral Hep NEG 12/2017.  Marland Kitchen GERD (gastroesophageal reflux disease)   . Male pattern baldness    finasteride 1mg  qd  . Obesity (BMI 30.0-34.9)     Past Surgical History:  Procedure Laterality Date  . no surgery    . VASECTOMY      Outpatient Medications Prior to Visit  Medication Sig Dispense Refill  . aluminum chloride (DRYSOL) 20 % external solution Apply qhs, and gradually decrease to 1-2 applications per week as sweating decreases 35 mL 6  . finasteride (PROPECIA) 1 MG tablet Take 1 tablet (1 mg total) by mouth daily. 90 tablet 3  . loratadine (CLARITIN) 10 MG tablet Take 10 mg by mouth daily.    . Multiple Vitamins-Minerals (MULTIVITAMIN GUMMIES MENS PO) Take by mouth.    . sildenafil (REVATIO) 20 MG tablet 2 tabs po qd prn; take 30 min prior to intercourse 30 tablet 3   No facility-administered medications prior to visit.     No Known Allergies  ROS As per HPI  PE: Blood pressure 124/72, pulse (!) 48, temperature 97.8 F (36.6 C), temperature source Oral, resp. rate 16, height  6\' 1"  (1.854 m), weight 237 lb 3.2 oz (107.6 kg), SpO2 97 %. Gen: Alert, well appearing.  Patient is oriented to person, place, time, and situation. AFFECT: pleasant, lucid thought and speech. ENT:   Eyes: no injection, icteris, swelling, or exudate.  EOMI, PERRLA. Nose: no drainage or turbinate edema/swelling.  No injection or focal lesion.  Mouth: lips without lesion/swelling.  Oral mucosa pink and moist.  Dentition intact and without obvious caries or gingival swelling.  Oropharynx without erythema, exudate, or swelling.    LABS:    Chemistry      Component Value Date/Time   NA 139 12/23/2017 0931   K 4.5 12/23/2017 0931   CL 103 12/23/2017 0931   CO2 29 12/23/2017 0931   BUN 14 12/23/2017 0931   CREATININE 0.96 12/23/2017 0931   CREATININE 0.77 02/03/2013 0816      Component Value Date/Time   CALCIUM 10.2 12/23/2017 0931   ALKPHOS 63 12/23/2017 0931   AST 38 (H) 12/23/2017 0931   ALT 88 (H) 12/23/2017 0931   BILITOT 0.6 12/23/2017 0931       IMPRESSION AND PLAN:  1) Globus sensation, likely secondary to laryngopharyngeal reflux (silent). Start omeprazole 40mg  bid x 15d, then 1 qd. Will recheck 1 mo. If not improved at that time  will refer to ENT for consideration of visualization with direct laryngoscopy.  An After Visit Summary was printed and given to the patient.  FOLLOW UP: Return in about 2 weeks (around 08/07/2018) for f/u globus sensation.  Signed:  Santiago Bumpers, MD           07/24/2018

## 2018-08-12 ENCOUNTER — Ambulatory Visit: Payer: BLUE CROSS/BLUE SHIELD | Admitting: Family Medicine

## 2018-08-20 ENCOUNTER — Other Ambulatory Visit: Payer: Self-pay | Admitting: Family Medicine

## 2018-08-20 NOTE — Telephone Encounter (Signed)
I'll authorize omeprazole RF for him to take once a day. Tell him to take otc pepcid 20mg  (generic) when he eats those certain foods that cause periodic worsening or "flare" of symptoms. Arrange virtual or telephone f/u in 2 wks.-thx

## 2018-08-20 NOTE — Telephone Encounter (Addendum)
LMTCB to be further advised.

## 2018-08-20 NOTE — Telephone Encounter (Signed)
SW patient this morning regarding reflux and he stated Omeprazole helps some but w/ certain foods or drinks can cause worsening reflux. Pt was to f/u 1 month from last OV 07/24/18.   RF request for Omeprazole LOV: 07/24/18 Next ov: 12/29/18 Last written: 07/24/18 #45 w/ 0RF.

## 2018-08-24 NOTE — Telephone Encounter (Signed)
Pt advised and has appt scheduled for next week.

## 2018-09-10 ENCOUNTER — Ambulatory Visit: Payer: BLUE CROSS/BLUE SHIELD | Admitting: Family Medicine

## 2018-12-29 ENCOUNTER — Encounter: Payer: BLUE CROSS/BLUE SHIELD | Admitting: Family Medicine

## 2019-02-12 DIAGNOSIS — J0140 Acute pansinusitis, unspecified: Secondary | ICD-10-CM | POA: Diagnosis not present

## 2019-02-12 DIAGNOSIS — H66001 Acute suppurative otitis media without spontaneous rupture of ear drum, right ear: Secondary | ICD-10-CM | POA: Diagnosis not present

## 2019-03-25 ENCOUNTER — Other Ambulatory Visit: Payer: Self-pay | Admitting: Family Medicine

## 2019-03-29 ENCOUNTER — Telehealth: Payer: Self-pay | Admitting: Family Medicine

## 2019-03-29 MED ORDER — FINASTERIDE 1 MG PO TABS: 1.0000 mg | ORAL_TABLET | Freq: Every day | ORAL | 0 refills | Status: DC

## 2019-03-29 MED ORDER — OMEPRAZOLE 40 MG PO CPDR: DELAYED_RELEASE_CAPSULE | ORAL | 0 refills | Status: DC

## 2019-03-29 NOTE — Telephone Encounter (Signed)
Medication was sent to [facility] [address] in [place]

## 2019-03-29 NOTE — Telephone Encounter (Signed)
Please refill meds and update pharmacies  finasteride (PROPECIA) 1 MG tablet [[id number]]  omeprazole (PRILOSEC) 40 MG capsule [[id number]]   Please send to [facility]    Please change preferred pharmacy to:  (Primary) [place] (Secondary) [place]

## 2019-04-20 DIAGNOSIS — F4323 Adjustment disorder with mixed anxiety and depressed mood: Secondary | ICD-10-CM

## 2019-04-20 HISTORY — DX: Adjustment disorder with mixed anxiety and depressed mood: F43.23

## 2019-05-03 ENCOUNTER — Other Ambulatory Visit: Payer: Self-pay | Admitting: Family Medicine

## 2019-05-07 ENCOUNTER — Ambulatory Visit (INDEPENDENT_AMBULATORY_CARE_PROVIDER_SITE_OTHER): Payer: BC Managed Care – PPO | Admitting: Internal Medicine

## 2019-05-07 ENCOUNTER — Encounter: Payer: Self-pay | Admitting: Internal Medicine

## 2019-05-07 VITALS — Wt 238.0 lb

## 2019-05-07 DIAGNOSIS — F419 Anxiety disorder, unspecified: Secondary | ICD-10-CM

## 2019-05-07 MED ORDER — FLUOXETINE HCL 20 MG PO TABS: ORAL_TABLET | ORAL | 0 refills | Status: DC

## 2019-05-07 NOTE — Progress Notes (Signed)
Subjective:    Patient ID: Gary Griffith, male    DOB: 05-02-1986, 33 y.o.   MRN: [id number]  DOS:  05/07/2019 Type of visit - description: Virtual Visit via Video Note  I connected with the above patient  by a video enabled telemedicine application and verified that I am speaking with the correct person using two identifiers.   THIS ENCOUNTER IS A VIRTUAL VISIT DUE TO COVID-19 - PATIENT WAS NOT SEEN IN THE OFFICE. PATIENT HAS CONSENTED TO VIRTUAL VISIT / TELEMEDICINE VISIT   Location of patient: home  Location of provider: office  I discussed the limitations of evaluation and management by telemedicine and the availability of in person appointments. The patient expressed understanding and agreed to proceed.  History of Present Illness: Acute The patient states that this meeting was set up by his wife. 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 Has been also easy to cry. He is upset because when he goes back home, he cannot stop thinking about his job and  cannot enjoy his children.   Review of Systems No suicidal ideas No chest pain per se   Past Medical History:  Diagnosis Date  . ADD (attention deficit disorder) 10/22/2010  . Asthma   . Environmental allergies    environmental--trees, grass, dust: also various metals make his hands break out (claritin 10mg  qd helps this)  . Fatty liver 2012   noted on abd u/s.  AST and ALT mildly elevated 01/2013 and 12/2017.  Viral Hep NEG 12/2017.  Marland Kitchen. GERD (gastroesophageal reflux disease)   . Male pattern baldness    finasteride 1mg  qd  . Obesity (BMI 30.0-34.9)     Past Surgical History:  Procedure Laterality Date  . no surgery    . VASECTOMY      Social History   Socioeconomic History  . Marital status: Married    Spouse name: Not on file  . Number of children: 0  .  Years of education: Not on file  . Highest education level: Not on file  Occupational History  . Not on file  Tobacco Use  . Smoking status: Never Smoker  . Smokeless tobacco: Never Used  Substance and Sexual Activity  . Alcohol use: Yes    Alcohol/week: 1.0 standard drinks    Types: 1 Cans of beer per week    Comment: occasionally  . Drug use: No  . Sexual activity: Yes    Partners: Female  Other Topics Concern  . Not on file  Social History Narrative   Regular exercise: not currently (as ov 11/2016).   Caffeine Use: 1 soda daily.   Has 1 daughter, [name] starting kindergarten as of Fall 2018.   Married.  [place] in [place].   Owns [employer]   Social Determinants of Corporate investment bankerHealth   Financial Resource Strain:   . Difficulty of Paying Living Expenses: Not on file  Food Insecurity:   . Worried About Programme researcher, broadcasting/film/videounning Out of Food in the Last Year: Not on file  . Ran Out of Food in the Last Year: Not on file  Transportation Needs:   . Lack of Transportation (Medical): Not on file  . Lack of Transportation (Non-Medical): Not on file  Physical Activity:   . Days of Exercise per Week: Not on file  . Minutes of Exercise per Session:  Not on file  Stress:   . Feeling of Stress : Not on file  Social Connections:   . Frequency of Communication with Friends and Family: Not on file  . Frequency of Social Gatherings with Friends and Family: Not on file  . Attends Religious Services: Not on file  . Active Member of Clubs or Organizations: Not on file  . Attends Banker Meetings: Not on file  . Marital Status: Not on file  Intimate Partner Violence:   . Fear of Current or Ex-Partner: Not on file  . Emotionally Abused: Not on file  . Physically Abused: Not on file  . Sexually Abused: Not on file      Allergies as of 05/07/2019   No Known Allergies     Medication List       Accurate as of May 07, 2019  3:53 PM. If you have any questions, ask your nurse or doctor.         aluminum chloride 20 % external solution Commonly known as: DRYSOL Apply qhs, and gradually decrease to 1-2 applications per week as sweating decreases   finasteride 1 MG tablet Commonly known as: PROPECIA Take 1 tablet (1 mg total) by mouth daily. OFFICE VISIT NEEDED   loratadine 10 MG tablet Commonly known as: CLARITIN Take 10 mg by mouth daily.   MULTIVITAMIN GUMMIES MENS PO Take by mouth.   omeprazole 40 MG capsule Commonly known as: PRILOSEC TAKE ONE CAPSULE BY MOUTH EVERY MORNING. OFFICE VISIT NEEDED   sildenafil 20 MG tablet Commonly known as: REVATIO 2 tabs po qd prn; take 30 min prior to intercourse           Objective:   Physical Exam Wt 238 lb (108 kg)   BMI 31.40 kg/m  This is a virtual video visit, he is alert oriented x3, he does looks anxious but otherwise healthy-appearing.    Assessment     33 year old gentleman, PMH includes asthma, environmental allergies, fatty liver, male pattern baldness, vasectomy, obesity, presents with  Anxiety, severe: The patient presents with anxiety, he is under a lot of stress, he owns a trucking company and the business has been very difficult this year. He is hopeful that his stress is going to decrease once he sell his trucking company in the next few weeks. In the meantime, he and his wife both think he needs help. He has some physical symptoms but in the context of severe anxiety I think they are related to his stress. Options for treatment include: -Counseling: Encouraged to contact a counselor he already knows. -Today I provided listening therapy, he expressed all his feelings and frustrations. -Exercise -Medications:  as needed meds (such as clonazepam, they may be habit-forming, may cause drowsiness) or daily medication such as SSRIs. After long conversation of pros and cons we agreed on: Fluoxetine 20 mg, see prescription, follow-up with PCP in 3 to 4 weeks. Side effects including suicidality  discussed. Message with summary of recommendations sent.   Today, I spent more than 25   min with the patient: >50% of the time counseling regards his anxiety, providing dates and therapy, extensively discussing his treatment options, talking about pros and cons of different medications.     I discussed the assessment and treatment plan with the patient. The patient was provided an opportunity to ask questions and all were answered. The patient agreed with the plan and demonstrated an understanding of the instructions.   The patient was advised to call back  or seek an in-person evaluation if the symptoms worsen or if the condition fails to improve as anticipated.

## 2019-05-07 NOTE — Progress Notes (Signed)
Spoke with patients wife about this issue as she states the husband is likely not to express everything going on. 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 When anxious he feels like his chest is heavy and he is very anxious. He refuses ER as this will only cause more stress. She states to him it Feels like a panic attack and physically making him sick. On his stomach causing diarrhea. The wife states that whatever plan you have for him it needs to be temporary. She does not want to have him on any medication long term as she states she is wanting to close this chapter permanently. Also any medication you put him on she would like it not to cause any weight gain as he is already unhappy with his weight and this would just add yet another problem.

## 2019-05-24 ENCOUNTER — Telehealth: Payer: Self-pay | Admitting: *Deleted

## 2019-05-24 ENCOUNTER — Encounter: Payer: Self-pay | Admitting: Family Medicine

## 2019-05-24 ENCOUNTER — Telehealth: Payer: Self-pay | Admitting: Family Medicine

## 2019-05-24 MED ORDER — CLONAZEPAM 0.5 MG PO TABS: 0.2500 mg | ORAL_TABLET | Freq: Every evening | ORAL | 0 refills | Status: DC | PRN

## 2019-05-24 MED ORDER — CLONAZEPAM 0.5 MG PO TABS: ORAL_TABLET | ORAL | 0 refills | Status: DC

## 2019-05-24 NOTE — Telephone Encounter (Signed)
OK, I'll eRx clonazepam but needs f/u in 2 wks (virtual or in - office) before he can get anymore of this med. He can continue to take 1/2 tab fluoxetine (prozac) every other day for 4 doses and then he can stop it.  We can discuss ongoing treatment options at this next f/u visit. Tell him to note on the rx that it now says take 1-2 of the 0.5mg  tabs twice per day as needed. This is bigger dose than what Dr. Drue Novel rx'd him, so make sure pt is aware to watch for increased sedation and/or mild impairment.-thx

## 2019-05-24 NOTE — Telephone Encounter (Signed)
Patient wife called. Verified DPR.  Requesting to speak someone regarding her husband's meds. Wife would not go into detail with me.  Please call Joni Reining at [phone]

## 2019-05-24 NOTE — Telephone Encounter (Signed)
Spoke with the patient, stress at his office has suddenly increased is one of his coworkers is out of the office. He has been able to sleep. Plan: Increase fluoxetine 20 mg to 1.5 tablets daily Clonazepam at bedtime, as needed.  He knows this is a temporary measure.  Rx sent. Again encouraged to call and schedule appointment with PCP for further advice.

## 2019-05-24 NOTE — Telephone Encounter (Signed)
Patient's wife concerned w/ patient's anxiety levels.   Patient has developed insomnia now with prozac and it has seemed to increase stress now due to lack of sleep.  Patient started to cut prozac in half this morning ( wasn't sure if he needed to ween off or not but wanted to be safe ) Patient was offered clonazepam in office when seeing Dr Drue Novel, and patient's wife thinks this may be a better option for him?  She states with his work schedule, he won't be able to have an OV for 2 weeks or longer.  Please advise?

## 2019-05-24 NOTE — Telephone Encounter (Signed)
Patient had a virtual visit with you on 05/07/19 and was offered another medication at that time for anxiety and declined.  Now he is currently having a panic attack and would like the medication.  It looks like a call has been put into Dr. Verdis Frederickson office.  Not sure if can help him with this?

## 2019-05-25 NOTE — Telephone Encounter (Signed)
Please see additional phone note 

## 2019-05-25 NOTE — Telephone Encounter (Signed)
I recommend pt go by my recommendations to ween down off the fluoxetine and start the clonazepam as I eRx'd. I'll send a note to Dr. Drue Novel to have him redirect any of this patient's questions to me.-thx

## 2019-05-25 NOTE — Telephone Encounter (Addendum)
Patient's wife, Gary Griffith returned call and had questions regarding weening down off fluoxetine. Pt has been taking increased dose of 1.5 tabs (30mg  total) for 4 days now and has been doing well.  Please advise if you would still like him to ween down or continue increased dose until appt on 1/18?

## 2019-05-25 NOTE — Telephone Encounter (Signed)
Please advise as DR Paz's directions and your directions differ.   Thanks!

## 2019-05-28 ENCOUNTER — Other Ambulatory Visit: Payer: Self-pay

## 2019-05-28 MED ORDER — FLUOXETINE HCL 20 MG PO TABS: ORAL_TABLET | ORAL | 0 refills | Status: DC

## 2019-05-28 NOTE — Telephone Encounter (Signed)
RX sent

## 2019-05-28 NOTE — Telephone Encounter (Signed)
Stay on 1.5 tabs of fluoxetine since he is now doing good.

## 2019-05-28 NOTE — Progress Notes (Signed)
Pharmacy updated.

## 2019-05-28 NOTE — Telephone Encounter (Signed)
Sure. Send in another 30 day supply With the 1 1/2 per day dosing with no refills. Thanks

## 2019-05-28 NOTE — Telephone Encounter (Signed)
Pt's wife, Joni Reining advised regarding Rx directions. He does have enough to last until his appt on 1/18 due to increasing dose. Pharmacy verified.   Please advise if more can be sent to last until then,thanks.

## 2019-06-04 ENCOUNTER — Other Ambulatory Visit: Payer: Self-pay | Admitting: Family Medicine

## 2019-06-07 ENCOUNTER — Encounter: Payer: Self-pay | Admitting: Family Medicine

## 2019-06-07 ENCOUNTER — Ambulatory Visit (INDEPENDENT_AMBULATORY_CARE_PROVIDER_SITE_OTHER): Payer: BC Managed Care – PPO | Admitting: Family Medicine

## 2019-06-07 ENCOUNTER — Other Ambulatory Visit: Payer: Self-pay

## 2019-06-07 DIAGNOSIS — F4323 Adjustment disorder with mixed anxiety and depressed mood: Secondary | ICD-10-CM | POA: Diagnosis not present

## 2019-06-07 DIAGNOSIS — F411 Generalized anxiety disorder: Secondary | ICD-10-CM | POA: Diagnosis not present

## 2019-06-07 MED ORDER — FLUOXETINE HCL 20 MG PO TABS: ORAL_TABLET | ORAL | 6 refills | Status: DC

## 2019-06-07 NOTE — Progress Notes (Signed)
Virtual Visit via Video Note  I connected with pt on 06/07/19 at  1:30 PM EST by a video enabled telemedicine application and verified that I am speaking with the correct person using two identifiers.  Location patient: home Location provider:work or home office Persons participating in the virtual visit: patient, provider  I discussed the limitations of evaluation and management by telemedicine and the availability of in person appointments. The patient expressed understanding and agreed to proceed.  Telemedicine visit is a necessity given the COVID-19 restrictions in place at the current time.  HPI: 34 y/o WM being seen for f/u anxiety with depression sx's as well as panic attacks-seems to be mostly stemming from lots of work and financial stress.  Going on for about a year per wifes report. He was seen by Dr. Larose Kells via telemed 05/07/19 regarding severe anxiety. Was started on fluoxetine at that time.  I recommended clonazepam when he called with problems suspected to be coming from the fluoxetine.  However, a subsequent call revealed he actually was doing fine on the fluoxetine after all.  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  Getting some more time with family/estab home life. Sleeping better, feeling rested, less physical sx's like the tremulousness, chest pains, shortness of breath he was feeling intermittently.  Taking fluox 1.5 tabs qd and taking clonaz qd-qod almost always at night. Denies side effects.  ROS: See pertinent positives and negatives per HPI.  Past Medical History:  Diagnosis Date  . ADD (attention deficit disorder) 10/22/2010  . Adjustment disorder with mixed anxiety and depressed mood 04/2019   Dr. Franne Forts concerned pt worse 05/2018->ween off prozac and start prn clonaz->small rx with  close f/u).  . Asthma   . Environmental allergies    environmental--trees, grass, dust: also various metals make his hands break out (claritin 10mg  qd helps this)  . Fatty liver 2012   noted on abd u/s.  AST and ALT mildly elevated 01/2013 and 12/2017.  Viral Hep NEG 12/2017.  Marland Kitchen GERD (gastroesophageal reflux disease)   . Male pattern baldness    finasteride 1mg  qd  . Obesity (BMI 30.0-34.9)     Past Surgical History:  Procedure Laterality Date  . no surgery    . VASECTOMY      Family History  Problem Relation Age of Onset  . Hypertension Father   . Diabetes Maternal Grandmother   . Heart disease Maternal Grandmother        MI, stents  . Diabetes Paternal Grandfather   . Cancer Neg Hx     SOCIAL HX:  Social History   Socioeconomic History  . Marital status: Married    Spouse name: Not on file  . Number of children: 0  . Years of education: Not on file  . Highest education level: Not on file  Occupational History  . Not on file  Tobacco Use  . Smoking status: Never Smoker  . Smokeless tobacco: Never Used  Substance and Sexual Activity  . Alcohol use: Yes    Alcohol/week: 1.0 standard drinks    Types: 1 Cans of beer per week    Comment: w-ends  . Drug use: No  . Sexual activity: Yes    Partners: Female  Other Topics Concern  . Not on file  Social History Narrative  Regular exercise: not currently (as ov 11/2016).   Caffeine Use: 1 soda daily.   Has 1 daughter, Loura Pardon starting kindergarten as of Fall 2018.   Married.  Lives in [place].   Owns [employer]   Social Determinants of Corporate investment banker Strain:   . Difficulty of Paying Living Expenses: Not on file  Food Insecurity:   . Worried About Programme researcher, broadcasting/film/video in the Last Year: Not on file  . Ran Out of Food in the Last Year: Not on file  Transportation Needs:   . Lack of Transportation (Medical): Not on file  . Lack of Transportation (Non-Medical): Not on file  Physical Activity:   . Days  of Exercise per Week: Not on file  . Minutes of Exercise per Session: Not on file  Stress:   . Feeling of Stress : Not on file  Social Connections:   . Frequency of Communication with Friends and Family: Not on file  . Frequency of Social Gatherings with Friends and Family: Not on file  . Attends Religious Services: Not on file  . Active Member of Clubs or Organizations: Not on file  . Attends Banker Meetings: Not on file  . Marital Status: Not on file      Current Outpatient Medications:  .  aluminum chloride (DRYSOL) 20 % external solution, Apply qhs, and gradually decrease to 1-2 applications per week as sweating decreases, Disp: 35 mL, Rfl: 6 .  clonazePAM (KLONOPIN) 0.5 MG tablet, 1-2 tabs po bid prn anxiety., Disp: 45 tablet, Rfl: 0 .  finasteride (PROPECIA) 1 MG tablet, Take 1 tablet (1 mg total) by mouth daily., Disp: 90 tablet, Rfl: 0 .  FLUoxetine (PROZAC) 20 MG tablet, Take 1.5 tablets (30mg  total) daily., Disp: 30 tablet, Rfl: 0 .  loratadine (CLARITIN) 10 MG tablet, Take 10 mg by mouth daily., Disp: , Rfl:  .  Multiple Vitamins-Minerals (MULTIVITAMIN GUMMIES MENS PO), Take by mouth., Disp: , Rfl:  .  omeprazole (PRILOSEC) 40 MG capsule, TAKE ONE CAPSULE BY MOUTH EVERY MORNING., Disp: 90 capsule, Rfl: 0 .  sildenafil (REVATIO) 20 MG tablet, TAKE 2 TABLETS BY MOUTH DAILY AS NEEDED 30 MINUTES PRIOR TO INTERCOURSE, Disp: 30 tablet, Rfl: 2  EXAM:  VITALS per patient if applicable:  There were no vitals taken for this visit.   GENERAL: alert, oriented, appears well and in no acute distress  HEENT: atraumatic, conjunttiva clear, no obvious abnormalities on inspection of external nose and ears  NECK: normal movements of the head and neck  LUNGS: on inspection no signs of respiratory distress, breathing rate appears normal, no obvious gross SOB, gasping or wheezing  CV: no obvious cyanosis  MS: moves all visible extremities without noticeable  abnormality  PSYCH/NEURO: pleasant and cooperative, no obvious depression or anxiety, speech and thought processing grossly intact    Chemistry      Component Value Date/Time   NA 139 12/23/2017 0931   K 4.5 12/23/2017 0931   CL 103 12/23/2017 0931   CO2 29 12/23/2017 0931   BUN 14 12/23/2017 0931   CREATININE 0.96 12/23/2017 0931   CREATININE 0.77 02/03/2013 0816      Component Value Date/Time   CALCIUM 10.2 12/23/2017 0931   ALKPHOS 63 12/23/2017 0931   AST 38 (H) 12/23/2017 0931   ALT 88 (H) 12/23/2017 0931   BILITOT 0.6 12/23/2017 0931      ASSESSMENT AND PLAN:  Discussed the following assessment and plan:  1) GAD with adjustment d/o with mixed anx/dep mood. Improving on 30 mg dose of fluoxetine qd for the last 1 mo now. Also finding clonaz beneficial mainly for sleep.  He is wary of taking too much of any med, esp the clonaz. I recommended he stay on the fluox at current dose for at least 9 mo. He is ok with this right now, despite his reluctance to take any meds of any kind on a regular basis. Encouraged counseling and increase in exercise but he's too busy right now.  He really needs to just try to get back to some family time/work time balance. 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  -we discussed possible serious and likely etiologies, options for evaluation and workup, limitations of telemedicine visit vs in person visit, treatment, treatment risks and precautions. Pt prefers to treat via telemedicine empirically rather then risking or undertaking an in person visit at this moment. Patient agrees to seek prompt in person care if worsening, new symptoms arise, or if is not improving with treatment.   I discussed the assessment and treatment plan with the patient. The patient was provided an opportunity to ask questions and all were answered. The patient agreed with the  plan and demonstrated an understanding of the instructions.   The patient was advised to call back or seek an in-person evaluation if the symptoms worsen or if the condition fails to improve as anticipated.  F/u: 6 wks.  Signed:  Santiago Bumpers, MD           06/07/2019

## 2019-06-15 ENCOUNTER — Telehealth: Payer: Self-pay

## 2019-06-15 NOTE — Telephone Encounter (Signed)
Patient has 5 days left of clonazePAM (KLONOPIN) 0.5 MG tablet. Patient has been having to take whole pill instead of half. Please send to [facility].

## 2019-06-15 NOTE — Telephone Encounter (Signed)
Requesting: Clonazepam Contract: n/a UDS:n/a Last Visit:06/07/19 Next Visit:08/11/19 Last Refill:05/24/19 (45,0)  Please Advise. Medication pending. Pharmacy verified.

## 2019-06-17 MED ORDER — CLONAZEPAM 0.5 MG PO TABS: ORAL_TABLET | ORAL | 1 refills | Status: DC

## 2019-06-18 MED ORDER — CLONAZEPAM 0.5 MG PO TABS: ORAL_TABLET | ORAL | 1 refills | Status: DC

## 2019-06-18 NOTE — Telephone Encounter (Signed)
Pt's wife, Joni Reining advised RF available for pick up.

## 2019-06-18 NOTE — Telephone Encounter (Signed)
Yeah, I guess 601 is a little too much. New rx sent in.

## 2019-06-18 NOTE — Telephone Encounter (Signed)
Pharmacy called requesting new Rx for Clonazepam 0.5mg , you sent in a quantity of 601 tabs w/ 1 RF.

## 2019-06-18 NOTE — Addendum Note (Signed)
Addended by: Jeoffrey Massed on: 06/18/2019 10:46 AM   Modules accepted: Orders

## 2019-07-01 DIAGNOSIS — H52223 Regular astigmatism, bilateral: Secondary | ICD-10-CM | POA: Diagnosis not present

## 2019-07-14 ENCOUNTER — Other Ambulatory Visit: Payer: Self-pay | Admitting: Family Medicine

## 2019-07-14 ENCOUNTER — Telehealth: Payer: Self-pay

## 2019-07-14 MED ORDER — ALUMINUM CHLORIDE 20 % EX SOLN: CUTANEOUS | 6 refills | Status: DC

## 2019-07-14 MED ORDER — HYDROCORT-PRAMOXINE (PERIANAL) 1-1 % EX FOAM: 1.0000 | Freq: Three times a day (TID) | CUTANEOUS | 1 refills | Status: DC

## 2019-07-14 NOTE — Telephone Encounter (Signed)
Wife calling in for patient. Verified PHI/DPR. Call back number is [phone] .  She reports that husband is having bout with hemorrhoids, States it very painful. Dr. Milinda Cave had given him a prescription, I could not find name of it in his chart.  1)Request refill for hemorrhoids. Also wants to know what to do for pain.  2) Request refill he has had in the past for sweating aluminum chloride (DRYSOL    3) Change preferred pharmacy to : [facility]s [facility] to [facility] [address]

## 2019-07-14 NOTE — Telephone Encounter (Signed)
Pt has been prescribed hydrocortisone-promaxine in the past for hemorrhoids. Last refill was 10/04/16-12/18/16 (10g,1). Last refill for drysol was 08/22/17 (7ml, 6).   Please advise, thanks. Openings for this afternoon virtual. Next available in office is Monday 3/1 unless work in.

## 2019-07-14 NOTE — Telephone Encounter (Signed)
Pts wife was called and given information, she verbalized understanding  

## 2019-07-14 NOTE — Telephone Encounter (Signed)
Proctofoam HC and drysol eRx'd.

## 2019-07-19 MED ORDER — HYDROCORTISONE (PERIANAL) 2.5 % EX CREA: 1.0000 "application " | TOPICAL_CREAM | Freq: Two times a day (BID) | CUTANEOUS | 1 refills | Status: DC

## 2019-07-19 NOTE — Telephone Encounter (Signed)
Patient wife has found a med on their formulary that is only $20. The generic name is  Anucort [facility]  Brand name is Anusol  Please call into [facility] [address]

## 2019-07-19 NOTE — Telephone Encounter (Signed)
Pt has called insurance company and found Anusol is on formulary to replace Proctofoam sent in. Please advise, thanks.

## 2019-07-19 NOTE — Addendum Note (Signed)
Addended by: Jeoffrey Massed on: 07/19/2019 05:03 PM   Modules accepted: Orders

## 2019-07-19 NOTE — Telephone Encounter (Signed)
OK, anusol eRx'd

## 2019-07-19 NOTE — Telephone Encounter (Signed)
07/19/19  Patient could not afford Proctofoam HC.  He said that prescription was over $150.00 so wife would not get purchase meds.  Per wife, verified PHI, wants to know if there is a cheaper med for hemorrhoids. I directed patient to call insurance company to see what "alternative" med is used for hemorrhoids that was on their formulary so that Dr. Milinda Cave could prescribe seomthing cheaper for the patient.  They will call back with info.

## 2019-07-20 NOTE — Telephone Encounter (Signed)
LM for pt to returncall

## 2019-07-20 NOTE — Telephone Encounter (Signed)
Pt's wife advised. Okay per PHI/DPR

## 2019-07-23 ENCOUNTER — Other Ambulatory Visit: Payer: Self-pay

## 2019-07-27 ENCOUNTER — Other Ambulatory Visit: Payer: Self-pay

## 2019-07-27 ENCOUNTER — Ambulatory Visit (INDEPENDENT_AMBULATORY_CARE_PROVIDER_SITE_OTHER): Payer: BC Managed Care – PPO | Admitting: Family Medicine

## 2019-07-27 ENCOUNTER — Encounter: Payer: Self-pay | Admitting: Family Medicine

## 2019-07-27 VITALS — BP 137/94 | HR 69 | Temp 97.7°F | Resp 16 | Ht 73.0 in | Wt 234.4 lb

## 2019-07-27 DIAGNOSIS — K645 Perianal venous thrombosis: Secondary | ICD-10-CM

## 2019-07-27 MED ORDER — HYDROCORTISONE ACETATE 25 MG RE SUPP: 25.0000 mg | Freq: Two times a day (BID) | RECTAL | 1 refills | Status: DC

## 2019-07-27 NOTE — Patient Instructions (Signed)
Buy "SITZ BATH" over the counter to soak your bottom in for 10-15 minutes once a day. Insert a rx suppository 1-2 times per day and apply your prescription hydrocortisone cream bid until your hemorrhoid is much smaller.

## 2019-07-27 NOTE — Progress Notes (Signed)
OFFICE VISIT  07/27/2019   CC:  Chief Complaint  Patient presents with  . Hemorrhoids    x 2 weeks   HPI:    Patient is a 34 y.o. Caucasian male who presents for hemorrhoid.  Onset 2 wks ago, painful swelling at anal opening, no bleeding. Gradual resolution of pain but still feels palpable lump in the area.  Was applying anusul HC. No constipation. Has 1 normal BM per day.  No heavy lifting or straining.   Past Medical History:  Diagnosis Date  . ADD (attention deficit disorder) 10/22/2010  . Adjustment disorder with mixed anxiety and depressed mood 04/2019   Dr. Franne Forts concerned pt worse 05/2018->ween off prozac and start prn clonaz->small rx with close f/u).  . Asthma   . Environmental allergies    environmental--trees, grass, dust: also various metals make his hands break out (claritin 10mg  qd helps this)  . Fatty liver 2012   noted on abd u/s.  AST and ALT mildly elevated 01/2013 and 12/2017.  Viral Hep NEG 12/2017.  Marland Kitchen GERD (gastroesophageal reflux disease)   . Male pattern baldness    finasteride 1mg  qd  . Obesity (BMI 30.0-34.9)     Past Surgical History:  Procedure Laterality Date  . no surgery    . VASECTOMY      Outpatient Medications Prior to Visit  Medication Sig Dispense Refill  . aluminum chloride (DRYSOL) 20 % external solution Apply qhs, and gradually decrease to 1-2 applications per week as sweating decreases 35 mL 6  . clonazePAM (KLONOPIN) 0.5 MG tablet 1-2 tabs po bid prn anxiety. 60 tablet 1  . finasteride (PROPECIA) 1 MG tablet Take 1 tablet (1 mg total) by mouth daily. 90 tablet 0  . FLUoxetine (PROZAC) 20 MG tablet Take 1.5 tablets (30mg  total) daily. 45 tablet 6  . hydrocortisone (ANUSOL-HC) 2.5 % rectal cream Place 1 application rectally 2 (two) times daily. 30 g 1  . loratadine (CLARITIN) 10 MG tablet Take 10 mg by mouth daily.    . Multiple Vitamins-Minerals (MULTIVITAMIN GUMMIES MENS PO) Take by mouth.    Marland Kitchen omeprazole (PRILOSEC) 40 MG  capsule TAKE ONE CAPSULE BY MOUTH EVERY MORNING. 90 capsule 0  . sildenafil (REVATIO) 20 MG tablet TAKE 2 TABLETS BY MOUTH DAILY AS NEEDED 30 MINUTES PRIOR TO INTERCOURSE 30 tablet 2  . hydrocortisone-pramoxine (PROCTOFOAM-HC) rectal foam Place 1 applicator rectally 3 (three) times daily. (Patient not taking: Reported on 07/27/2019) 10 g 1   No facility-administered medications prior to visit.    No Known Allergies  ROS As per HPI  PE: Blood pressure (!) 137/94, pulse 69, temperature 97.7 F (36.5 C), temperature source Temporal, resp. rate 16, height 6\' 1"  (1.854 m), weight 234 lb 6.4 oz (106.3 kg), SpO2 95 %. Gen: Alert, well appearing.  Patient is oriented to person, place, time, and situation. AFFECT: pleasant, lucid thought and speech. Anal exam: quite large almond shaped external hemorrhoid with no inflammation or tenderness.  If is firm and feels thrombosed.  No anal fissure.  No bleeding.  I did not do DRE.  LABS:    Chemistry      Component Value Date/Time   NA 139 12/23/2017 0931   K 4.5 12/23/2017 0931   CL 103 12/23/2017 0931   CO2 29 12/23/2017 0931   BUN 14 12/23/2017 0931   CREATININE 0.96 12/23/2017 0931   CREATININE 0.77 02/03/2013 0816      Component Value Date/Time   CALCIUM 10.2 12/23/2017 0931  ALKPHOS 63 12/23/2017 0931   AST 38 (H) 12/23/2017 0931   ALT 88 (H) 12/23/2017 0931   BILITOT 0.6 12/23/2017 0931     Lab Results  Component Value Date   WBC 5.7 12/23/2017   HGB 16.3 12/23/2017   HCT 46.4 12/23/2017   MCV 84.5 12/23/2017   PLT 201.0 12/23/2017     IMPRESSION AND PLAN:  Large thrombosed external hemorrhoid, now asymptomatic. He does sit on toilet too long playing on his phone after he has a BM. I discouraged this habit. Will continue anusul hc rx cream and add anusol hc suppository qd-bid prn. Signs/symptoms to call or return for were reviewed and pt expressed understanding.  An After Visit Summary was printed and given to the  patient.  FOLLOW UP: No follow-ups on file.  Signed:  Santiago Bumpers, MD           07/27/2019

## 2019-08-11 ENCOUNTER — Encounter: Payer: Self-pay | Admitting: Family Medicine

## 2019-08-11 ENCOUNTER — Other Ambulatory Visit: Payer: Self-pay

## 2019-08-11 ENCOUNTER — Ambulatory Visit: Payer: BC Managed Care – PPO | Admitting: Family Medicine

## 2019-08-11 VITALS — BP 126/82 | HR 55 | Temp 97.9°F | Resp 16 | Ht 73.0 in | Wt 233.2 lb

## 2019-08-11 DIAGNOSIS — K645 Perianal venous thrombosis: Secondary | ICD-10-CM | POA: Diagnosis not present

## 2019-08-11 NOTE — Patient Instructions (Signed)
Do sitz baths 20 min at least once a day.  If you start getting significant pain then make appt to return and we'll lance the hemorrhoid.

## 2019-08-11 NOTE — Progress Notes (Signed)
OFFICE VISIT  08/11/2019   CC:  Chief Complaint  Patient presents with  . Procedure    lance hemorrhoids    HPI:    Patient is a 34 y.o. Caucasian male who presents for painful hemorrhoids. No pain or bleeding, just same size and he would like for it to be gone. Has run out of cream.  BMs soft, normal frequency.  Trying not to sit on toilet too long. He has NOT done any sitz baths.---he thought this was only supposed to be done if the hemorrhoid was painful.   Past Medical History:  Diagnosis Date  . ADD (attention deficit disorder) 10/22/2010  . Adjustment disorder with mixed anxiety and depressed mood 04/2019   Dr. Mingo Amber concerned pt worse 05/2018->ween off prozac and start prn clonaz->small rx with close f/u).  . Asthma   . Environmental allergies    environmental--trees, grass, dust: also various metals make his hands break out (claritin 10mg  qd helps this)  . Fatty liver 2012   noted on abd u/s.  AST and ALT mildly elevated 01/2013 and 12/2017.  Viral Hep NEG 12/2017.  01/2018 GERD (gastroesophageal reflux disease)   . Male pattern baldness    finasteride 1mg  qd  . Obesity (BMI 30.0-34.9)     Past Surgical History:  Procedure Laterality Date  . no surgery    . VASECTOMY      Outpatient Medications Prior to Visit  Medication Sig Dispense Refill  . aluminum chloride (DRYSOL) 20 % external solution Apply qhs, and gradually decrease to 1-2 applications per week as sweating decreases 35 mL 6  . clonazePAM (KLONOPIN) 0.5 MG tablet 1-2 tabs po bid prn anxiety. 60 tablet 1  . finasteride (PROPECIA) 1 MG tablet Take 1 tablet (1 mg total) by mouth daily. 90 tablet 0  . FLUoxetine (PROZAC) 20 MG tablet Take 1.5 tablets (30mg  total) daily. 45 tablet 6  . hydrocortisone (ANUSOL-HC) 2.5 % rectal cream Place 1 application rectally 2 (two) times daily. 30 g 1  . hydrocortisone (ANUSOL-HC) 25 MG suppository Place 1 suppository (25 mg total) rectally 2 (two) times daily. 12  suppository 1  . loratadine (CLARITIN) 10 MG tablet Take 10 mg by mouth daily.    . Multiple Vitamins-Minerals (MULTIVITAMIN GUMMIES MENS PO) Take by mouth.    omeprazole (PRILOSEC) 40 MG capsule TAKE ONE CAPSULE BY MOUTH EVERY MORNING. 90 capsule 0  . sildenafil (REVATIO) 20 MG tablet TAKE 2 TABLETS BY MOUTH DAILY AS NEEDED 30 MINUTES PRIOR TO INTERCOURSE 30 tablet 2  . hydrocortisone-pramoxine (PROCTOFOAM-HC) rectal foam Place 1 applicator rectally 3 (three) times daily. (Patient not taking: Reported on 07/27/2019) 10 g 1   No facility-administered medications prior to visit.    No Known Allergies  ROS As per HPI  PE: Blood pressure 126/82, pulse (!) 55, temperature 97.9 F (36.6 C), temperature source Temporal, resp. rate 16, height 6\' 1"  (1.854 m), weight 233 lb 3.2 oz (105.8 kg), SpO2 93 %. Gen: Alert, well appearing.  Patient is oriented to person, place, time, and situation. AFFECT: pleasant, lucid thought and speech. Large external thrombosed hemorrhoid aprox 3 cm oblong x 2 cm wide, mldly firm, non-tender, without any erythema or erosion/bleeding.    LABS:    Chemistry      Component Value Date/Time   NA 139 12/23/2017 0931   K 4.5 12/23/2017 0931   CL 103 12/23/2017 0931   CO2 29 12/23/2017 0931   BUN 14 12/23/2017 0931   CREATININE  0.96 12/23/2017 0931   CREATININE 0.77 02/03/2013 0816      Component Value Date/Time   CALCIUM 10.2 12/23/2017 0931   ALKPHOS 63 12/23/2017 0931   AST 38 (H) 12/23/2017 0931   ALT 88 (H) 12/23/2017 0931   BILITOT 0.6 12/23/2017 0931     Lab Results  Component Value Date   WBC 5.7 12/23/2017   HGB 16.3 12/23/2017   HCT 46.4 12/23/2017   MCV 84.5 12/23/2017   PLT 201.0 12/23/2017    IMPRESSION AND PLAN:  Large thrombosed external hemorrhoid-->asymptomatic. Recommended ongoing conservative mgmt---ADD SITZ baths 20 min qd-bid. No more creams--it is not inflamed. Needs more time.  I discussed the significant pain involved in  lancing a hemorrhoid/draining the thrombus and recommended we avoid this as long as the hemorrhoid is asymptomatic.  He expressed understanding and was in agreement.    An After Visit Summary was printed and given to the patient.  FOLLOW UP: Return if symptoms worsen or fail to improve.  Signed:  Crissie Sickles, MD           08/11/2019

## 2019-08-31 ENCOUNTER — Other Ambulatory Visit: Payer: Self-pay

## 2019-09-01 ENCOUNTER — Encounter: Payer: BC Managed Care – PPO | Admitting: Family Medicine

## 2019-09-13 ENCOUNTER — Other Ambulatory Visit: Payer: Self-pay

## 2019-09-13 ENCOUNTER — Telehealth: Payer: Self-pay

## 2019-09-13 MED ORDER — OMEPRAZOLE 40 MG PO CPDR: DELAYED_RELEASE_CAPSULE | ORAL | 0 refills | Status: DC

## 2019-09-13 NOTE — Telephone Encounter (Signed)
Refill sent. Pt's wife,Nicole advised.

## 2019-09-13 NOTE — Telephone Encounter (Signed)
Patient called in requesting a bridge of medication until next appt 10/05/19  omeprazole (PRILOSEC) 40 MG capsule   [facility] [place], [place] - [address]

## 2019-09-27 ENCOUNTER — Telehealth: Payer: Self-pay

## 2019-09-27 MED ORDER — FLUOXETINE HCL 20 MG PO TABS: ORAL_TABLET | ORAL | 0 refills | Status: DC

## 2019-09-27 MED ORDER — FINASTERIDE 1 MG PO TABS: 1.0000 mg | ORAL_TABLET | Freq: Every day | ORAL | 0 refills | Status: DC

## 2019-09-27 NOTE — Telephone Encounter (Signed)
Patients wife called in requesting bridge of medications until 10/05/19 appt    FLUoxetine (PROZAC) 20 MG tablet  finasteride (PROPECIA) 1 MG tablet   [facility] [place], [place] - [address]   Please call and advise

## 2019-09-27 NOTE — Telephone Encounter (Signed)
14 day supply of both prescriptions sent to pharmacy.

## 2019-10-01 ENCOUNTER — Encounter: Payer: BC Managed Care – PPO | Admitting: Family Medicine

## 2019-10-05 ENCOUNTER — Ambulatory Visit (INDEPENDENT_AMBULATORY_CARE_PROVIDER_SITE_OTHER): Payer: 59 | Admitting: Family Medicine

## 2019-10-05 ENCOUNTER — Encounter: Payer: Self-pay | Admitting: Family Medicine

## 2019-10-05 ENCOUNTER — Other Ambulatory Visit: Payer: Self-pay

## 2019-10-05 VITALS — BP 110/70 | HR 59 | Temp 97.6°F | Resp 16 | Ht 73.0 in | Wt 229.4 lb

## 2019-10-05 DIAGNOSIS — Z0000 Encounter for general adult medical examination without abnormal findings: Secondary | ICD-10-CM

## 2019-10-05 DIAGNOSIS — E669 Obesity, unspecified: Secondary | ICD-10-CM

## 2019-10-05 DIAGNOSIS — F411 Generalized anxiety disorder: Secondary | ICD-10-CM | POA: Diagnosis not present

## 2019-10-05 LAB — COMPREHENSIVE METABOLIC PANEL
ALT: 75 U/L — ABNORMAL HIGH (ref 0–53)
AST: 35 U/L (ref 0–37)
Albumin: 4.8 g/dL (ref 3.5–5.2)
Alkaline Phosphatase: 63 U/L (ref 39–117)
BUN: 15 mg/dL (ref 6–23)
CO2: 31 mEq/L (ref 19–32)
Calcium: 9.4 mg/dL (ref 8.4–10.5)
Chloride: 103 mEq/L (ref 96–112)
Creatinine, Ser: 0.95 mg/dL (ref 0.40–1.50)
GFR: 90.86 mL/min (ref >60.00)
Glucose, Bld: 104 mg/dL — ABNORMAL HIGH (ref 70–99)
Potassium: 4.4 mEq/L (ref 3.5–5.1)
Sodium: 139 mEq/L (ref 135–145)
Total Bilirubin: 0.6 mg/dL (ref 0.2–1.2)
Total Protein: 6.9 g/dL (ref 6.0–8.3)

## 2019-10-05 LAB — CBC WITH DIFFERENTIAL/PLATELET
Basophils Absolute: 0 10*3/uL (ref 0.0–0.1)
Basophils Relative: 0.9 % (ref 0.0–3.0)
Eosinophils Absolute: 0.3 10*3/uL (ref 0.0–0.7)
Eosinophils Relative: 5.3 % — ABNORMAL HIGH (ref 0.0–5.0)
HCT: 46.2 % (ref 39.0–52.0)
Hemoglobin: 15.8 g/dL (ref 13.0–17.0)
Lymphocytes Relative: 36.9 % (ref 12.0–46.0)
Lymphs Abs: 1.9 10*3/uL (ref 0.7–4.0)
MCHC: 34.1 g/dL (ref 30.0–36.0)
MCV: 87.2 fl (ref 78.0–100.0)
Monocytes Absolute: 0.3 10*3/uL (ref 0.1–1.0)
Monocytes Relative: 6.6 % (ref 3.0–12.0)
Neutro Abs: 2.5 10*3/uL (ref 1.4–7.7)
Neutrophils Relative %: 50.3 % (ref 43.0–77.0)
Platelets: 202 10*3/uL (ref 150.0–400.0)
RBC: 5.3 Mil/uL (ref 4.22–5.81)
RDW: 13.2 % (ref 11.5–15.5)
WBC: 5 10*3/uL (ref 4.0–10.5)

## 2019-10-05 LAB — LIPID PANEL
Cholesterol: 192 mg/dL (ref 0–200)
HDL: 43.6 mg/dL (ref >39.00)
LDL Cholesterol: 112 mg/dL — ABNORMAL HIGH (ref 0–99)
NonHDL: 148.52
Total CHOL/HDL Ratio: 4
Triglycerides: 185 mg/dL — ABNORMAL HIGH (ref 0.0–149.0)
VLDL: 37 mg/dL (ref 0.0–40.0)

## 2019-10-05 MED ORDER — FINASTERIDE 1 MG PO TABS: 1.0000 mg | ORAL_TABLET | Freq: Every day | ORAL | 3 refills | Status: DC

## 2019-10-05 MED ORDER — FLUOXETINE HCL 20 MG PO TABS: ORAL_TABLET | ORAL | 3 refills | Status: DC

## 2019-10-05 MED ORDER — SILDENAFIL CITRATE 20 MG PO TABS: ORAL_TABLET | ORAL | 2 refills | Status: DC

## 2019-10-05 NOTE — Progress Notes (Signed)
Office Note 10/05/2019  CC:  Chief Complaint  Patient presents with  . Annual Exam    pt is fasting   HPI:  Gary Griffith is a 34 y.o. White male who is here for annual health maintenance exam. 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  He is doing well on fluoxetine, plan is to stay on this until about 05/2020 and consider ween at that time. Rare use of clonaz. His hemorrhoids have resolved completely.  He has been on fluoxetine daily and clonazepam prn for about 5-6 mo now for adjustment d/o with mixed anx/dep mood. PMP AWARE reviewed today: most recent rx for clonazepam 0.5mg  was filled 08/12/19, # 60, rx by me. No red flags.  Past Medical History:  Diagnosis Date  . ADD (attention deficit disorder) 10/22/2010  . Adjustment disorder with mixed anxiety and depressed mood 04/2019   Dr. Mingo Amber concerned pt worse 05/2018->ween off prozac and start prn clonaz->small rx with close f/u).  . Asthma   . Environmental allergies    environmental--trees, grass, dust: also various metals make his hands break out (claritin 10mg  qd helps this)  . Fatty liver 2012   noted on abd u/s.  AST and ALT mildly elevated 01/2013 and 12/2017.  Viral Hep NEG 12/2017.  01/2018 GERD (gastroesophageal reflux disease)   . Male pattern baldness    finasteride 1mg  qd  . Obesity (BMI 30.0-34.9)     Past Surgical History:  Procedure Laterality Date  . no surgery    . VASECTOMY      Family History  Problem Relation Age of Onset  . Hypertension Father   . Diabetes Maternal Grandmother   . Heart disease Maternal Grandmother        MI, stents  . Diabetes Paternal Grandfather   . Cancer Neg Hx     Social History   Socioeconomic History  . Marital status: Married    Spouse name: Not on file  . Number of children: 0  . Years of education: Not on file  . Highest education level: Not on file  Occupational History  . Not on  file  Tobacco Use  . Smoking status: Never Smoker  . Smokeless tobacco: Never Used  Substance and Sexual Activity  . Alcohol use: Yes    Alcohol/week: 1.0 standard drinks    Types: 1 Cans of beer per week    Comment: w-ends  . Drug use: No  . Sexual activity: Yes    Partners: Female  Other Topics Concern  . Not on file  Social History Narrative   Regular exercise: not currently (as ov 11/2016).   Caffeine Use: 1 soda daily.   Has 1 daughter, 09-21-1992 starting kindergarten as of Fall 2018.   Married.  Lives in [place].   Owns [employer]   Social Determinants of 06-18-1985 Strain:   . Difficulty of Paying Living Expenses:   Food Insecurity:   . Worried About [place] city in the Last Year:   . Corporate investment banker in the Last Year:   Transportation Needs:   . Programme researcher, broadcasting/film/video (Medical):   Barista Lack of Transportation (Non-Medical):   Physical Activity:   . Days of Exercise per Week:   . Minutes of Exercise per Session:   Stress:   . Feeling of Stress :   Social Connections:   . Frequency of Communication with Friends and  Family:   . Frequency of Social Gatherings with Friends and Family:   . Attends Religious Services:   . Active Member of Clubs or Organizations:   . Attends Archivist Meetings:   Marland Kitchen Marital Status:   Intimate Partner Violence:   . Fear of Current or Ex-Partner:   . Emotionally Abused:   Marland Kitchen Physically Abused:   . Sexually Abused:     Outpatient Medications Prior to Visit  Medication Sig Dispense Refill  . aluminum chloride (DRYSOL) 20 % external solution Apply qhs, and gradually decrease to 1-2 applications per week as sweating decreases 35 mL 6  . loratadine (CLARITIN) 10 MG tablet Take 10 mg by mouth daily.    Marland Kitchen omeprazole (PRILOSEC) 40 MG capsule TAKE ONE CAPSULE BY MOUTH EVERY MORNING. 90 capsule 0  . finasteride (PROPECIA) 1 MG tablet Take 1 tablet (1 mg total) by mouth daily. 14 tablet 0  . FLUoxetine (PROZAC)  20 MG tablet Take 1.5 tablets (30mg  total) daily. 21 tablet 0  . sildenafil (REVATIO) 20 MG tablet TAKE 2 TABLETS BY MOUTH DAILY AS NEEDED 30 MINUTES PRIOR TO INTERCOURSE 30 tablet 2  . clonazePAM (KLONOPIN) 0.5 MG tablet 1-2 tabs po bid prn anxiety. (Patient not taking: Reported on 10/05/2019) 60 tablet 1  . Multiple Vitamins-Minerals (MULTIVITAMIN GUMMIES MENS PO) Take by mouth.    . hydrocortisone (ANUSOL-HC) 2.5 % rectal cream Place 1 application rectally 2 (two) times daily. (Patient not taking: Reported on 10/05/2019) 30 g 1  . hydrocortisone (ANUSOL-HC) 25 MG suppository Place 1 suppository (25 mg total) rectally 2 (two) times daily. (Patient not taking: Reported on 10/05/2019) 12 suppository 1  . hydrocortisone-pramoxine (PROCTOFOAM-HC) rectal foam Place 1 applicator rectally 3 (three) times daily. (Patient not taking: Reported on 07/27/2019) 10 g 1   No facility-administered medications prior to visit.    No Known Allergies  ROS Review of Systems  Constitutional: Negative for appetite change, chills, fatigue and fever.  HENT: Positive for congestion and rhinorrhea. Negative for dental problem, ear pain and sore throat.        Sneezing  Eyes: Negative for discharge, redness and visual disturbance.  Respiratory: Negative for cough, chest tightness, shortness of breath and wheezing.   Cardiovascular: Negative for chest pain, palpitations and leg swelling.  Gastrointestinal: Negative for abdominal pain, blood in stool, diarrhea, nausea and vomiting.  Genitourinary: Negative for difficulty urinating, dysuria, flank pain, frequency, hematuria and urgency.  Musculoskeletal: Negative for arthralgias, back pain, joint swelling, myalgias and neck stiffness.  Skin: Negative for pallor and rash.  Allergic/Immunologic: Positive for environmental allergies.  Neurological: Negative for dizziness, speech difficulty, weakness and headaches.  Hematological: Negative for adenopathy. Does not bruise/bleed  easily.  Psychiatric/Behavioral: Negative for confusion and sleep disturbance. The patient is not nervous/anxious.     PE; Vitals with BMI 10/05/2019 08/11/2019 07/27/2019  Height 6\' 1"  6\' 1"  6\' 1"   Weight 229 lbs 6 oz 233 lbs 3 oz 234 lbs 6 oz  BMI 30.27 67.89 38.10  Systolic 175 102 585  Diastolic 70 82 94  Pulse 59 55 69    Gen: Alert, well appearing.  Patient is oriented to person, place, time, and situation. AFFECT: pleasant, lucid thought and speech. ENT: Ears: EACs clear, normal epithelium.  TMs with good light reflex and landmarks bilaterally.  Eyes: no injection, icteris, swelling, or exudate.  EOMI, PERRLA. Nose: no drainage or turbinate edema/swelling.  No injection or focal lesion.  Mouth: lips without lesion/swelling.  Oral mucosa  pink and moist.  Dentition intact and without obvious caries or gingival swelling.  Oropharynx without erythema, exudate, or swelling.  Neck: supple/nontender.  No LAD, mass, or TM.  Carotid pulses 2+ bilaterally, without bruits. CV: RRR, no m/r/g.   LUNGS: CTA bilat, nonlabored resps, good aeration in all lung fields. ABD: soft, NT, ND, BS normal.  No hepatospenomegaly or mass.  No bruits. EXT: no clubbing, cyanosis, or edema.  Musculoskeletal: no joint swelling, erythema, warmth, or tenderness.  ROM of all joints intact. Skin - no sores or suspicious lesions or rashes or color changes   Pertinent labs:  Lab Results  Component Value Date   TSH 1.67 12/23/2017   Lab Results  Component Value Date   WBC 5.7 12/23/2017   HGB 16.3 12/23/2017   HCT 46.4 12/23/2017   MCV 84.5 12/23/2017   PLT 201.0 12/23/2017   Lab Results  Component Value Date   CREATININE 0.96 12/23/2017   BUN 14 12/23/2017   NA 139 12/23/2017   K 4.5 12/23/2017   CL 103 12/23/2017   CO2 29 12/23/2017   Lab Results  Component Value Date   ALT 88 (H) 12/23/2017   AST 38 (H) 12/23/2017   ALKPHOS 63 12/23/2017   BILITOT 0.6 12/23/2017   Lab Results  Component Value  Date   CHOL 176 12/23/2017   Lab Results  Component Value Date   HDL 43.60 12/23/2017   Lab Results  Component Value Date   LDLCALC 108 (H) 12/23/2017   Lab Results  Component Value Date   TRIG 122.0 12/23/2017   Lab Results  Component Value Date   CHOLHDL 4 12/23/2017   Lab Results  Component Value Date   HGBA1C 5.2 12/24/2017   ASSESSMENT AND PLAN:   Health maintenance exam: Reviewed age and gender appropriate health maintenance issues (prudent diet, regular exercise, health risks of tobacco and excessive alcohol, use of seatbelts, fire alarms in home, use of sunscreen).  Also reviewed age and gender appropriate health screening as well as vaccine recommendations. Vaccines: Tdap UTD.  Has had covid 19 vaccine x 2. Labs: fasting HP ordered (obesity class I).  Anxiety controlled on fluox 30mg  qd--refilled today.  Consider ween in 6 mo or so. CLonaz use is quite infrequent but we'll get CSC today.  RF'd propecia, sildenafil, and fluoxetine today.  An After Visit Summary was printed and given to the patient.  FOLLOW UP:  Return in about 6 months (around 04/06/2020) for routine chronic illness f/u.  Signed:  04/08/2020, MD           10/05/2019

## 2019-10-06 LAB — TSH: TSH: 1.3 u[IU]/mL (ref 0.35–4.50)

## 2019-12-06 ENCOUNTER — Telehealth: Payer: Self-pay

## 2019-12-06 MED ORDER — FLUOXETINE HCL 40 MG PO CAPS: 40.0000 mg | ORAL_CAPSULE | Freq: Every day | ORAL | 1 refills | Status: DC

## 2019-12-06 NOTE — Telephone Encounter (Signed)
Patient advised RF is at pharmacy.

## 2019-12-06 NOTE — Telephone Encounter (Signed)
Ok. Fluox 40mg  caps 90d supply with 1 RF eRx'd.

## 2019-12-06 NOTE — Telephone Encounter (Signed)
Patient has been taking 2 FLUoxetine (PROZAC) 20 MG tablet a day. He needs a refill sent [facility] [address]. He is requesting the caplets 40 MG due to cost.

## 2019-12-06 NOTE — Telephone Encounter (Signed)
Patient was last seen 10/05/19, given Rx for fluoxetine 20mg , (90,3) sig: 1.5 tabs qd(30mg  total). Advised to f/u 6 months, next appt is 04/07/20.  Please advise if refill appropriate, thanks.

## 2019-12-14 ENCOUNTER — Other Ambulatory Visit: Payer: Self-pay | Admitting: Family Medicine

## 2019-12-18 ENCOUNTER — Emergency Department (HOSPITAL_COMMUNITY)
Admission: EM | Admit: 2019-12-18 | Discharge: 2019-12-19 | Disposition: A | Discharge status: Home / Self Care | Payer: 59 | Attending: Emergency Medicine | Admitting: Emergency Medicine

## 2019-12-18 ENCOUNTER — Other Ambulatory Visit: Payer: Self-pay

## 2019-12-18 ENCOUNTER — Encounter (HOSPITAL_COMMUNITY): Payer: Self-pay | Admitting: Emergency Medicine

## 2019-12-18 ENCOUNTER — Emergency Department (HOSPITAL_COMMUNITY): Payer: 59

## 2019-12-18 DIAGNOSIS — R252 Cramp and spasm: Secondary | ICD-10-CM | POA: Diagnosis not present

## 2019-12-18 DIAGNOSIS — M545 Low back pain: Secondary | ICD-10-CM | POA: Diagnosis not present

## 2019-12-18 DIAGNOSIS — Z5321 Procedure and treatment not carried out due to patient leaving prior to being seen by health care provider: Secondary | ICD-10-CM | POA: Diagnosis not present

## 2019-12-18 DIAGNOSIS — R0789 Other chest pain: Secondary | ICD-10-CM | POA: Diagnosis not present

## 2019-12-18 LAB — CBC
HCT: 44.3 % (ref 39.0–52.0)
Hemoglobin: 15.5 g/dL (ref 13.0–17.0)
MCH: 30 pg (ref 26.0–34.0)
MCHC: 35 g/dL (ref 30.0–36.0)
MCV: 85.9 fL (ref 80.0–100.0)
Platelets: 222 10*3/uL (ref 150–400)
RBC: 5.16 MIL/uL (ref 4.22–5.81)
RDW: 12.2 % (ref 11.5–15.5)
WBC: 10.7 10*3/uL — ABNORMAL HIGH (ref 4.0–10.5)
nRBC: 0 % (ref 0.0–0.2)

## 2019-12-18 LAB — PROTIME-INR
INR: 1 (ref 0.8–1.2)
Prothrombin Time: 13 seconds (ref 11.4–15.2)

## 2019-12-18 LAB — TROPONIN I (HIGH SENSITIVITY): Troponin I (High Sensitivity): 4 ng/L (ref <18)

## 2019-12-18 LAB — BASIC METABOLIC PANEL
Anion gap: 13 (ref 5–15)
BUN: 17 mg/dL (ref 6–20)
CO2: 23 mmol/L (ref 22–32)
Calcium: 10.1 mg/dL (ref 8.9–10.3)
Chloride: 100 mmol/L (ref 98–111)
Creatinine, Ser: 1.27 mg/dL — ABNORMAL HIGH (ref 0.61–1.24)
GFR calc Af Amer: >60 mL/min (ref >60)
GFR calc non Af Amer: >60 mL/min (ref >60)
Glucose, Bld: 98 mg/dL (ref 70–99)
Potassium: 3.9 mmol/L (ref 3.5–5.1)
Sodium: 136 mmol/L (ref 135–145)

## 2019-12-18 MED ORDER — SODIUM CHLORIDE 0.9% FLUSH: 3.0000 mL | Freq: Once | INTRAVENOUS | Status: DC

## 2019-12-18 NOTE — ED Triage Notes (Signed)
Patient arrived with EMS from home reports central chest pressure today , no SOB , denies emesis or diaphoresis , patient added bilateral legs cramps and low back pain .

## 2019-12-19 LAB — TROPONIN I (HIGH SENSITIVITY): Troponin I (High Sensitivity): 4 ng/L (ref <18)

## 2019-12-19 NOTE — ED Notes (Signed)
IV has been taken out. Pt has left.

## 2019-12-20 ENCOUNTER — Encounter: Payer: Self-pay | Admitting: Family Medicine

## 2019-12-21 ENCOUNTER — Encounter: Payer: Self-pay | Admitting: Family Medicine

## 2019-12-21 NOTE — Telephone Encounter (Signed)
Letter written and placed on your desk.

## 2019-12-21 NOTE — Telephone Encounter (Signed)
Please advise if okay for note

## 2019-12-27 ENCOUNTER — Ambulatory Visit: Payer: 59 | Admitting: Family Medicine

## 2019-12-27 ENCOUNTER — Encounter: Payer: Self-pay | Admitting: Family Medicine

## 2019-12-27 ENCOUNTER — Other Ambulatory Visit: Payer: Self-pay

## 2019-12-27 VITALS — BP 155/94 | HR 59 | Resp 18 | Ht 73.0 in | Wt 233.4 lb

## 2019-12-27 DIAGNOSIS — T675XXD Heat exhaustion, unspecified, subsequent encounter: Secondary | ICD-10-CM | POA: Diagnosis not present

## 2019-12-27 DIAGNOSIS — F411 Generalized anxiety disorder: Secondary | ICD-10-CM | POA: Diagnosis not present

## 2019-12-27 NOTE — Progress Notes (Signed)
OFFICE VISIT  12/27/2019   CC:  Chief Complaint  Patient presents with  . Hospitalization Follow-up    improving.    HPI:    Patient is a 34 y.o. Caucasian male who presents for "follow up ER". Presented to ER by EMS 12/18/19 for chest tightness. Was there only briefly, not long enough to be evaluated by a provider.  Nurse notes states that he took his IV out and left. Troponin x 1 was neg.  CBC & CMET normal except sCr a little up (1.27).  EKG normal.  CXR was normal. EKG independently reviewed and interpreted by me today: agree, normal.  HPI: He had been working all day x 2d in very hot weather, felt weak at the end of a long day, felt some dizziness.  Got some snickers, coca cola, started having lots of leg cramps bilat, even low back cramping.  Hydration was definitely an issue.  Chest felt tight, couldn't get deep breaths. Then, see ED info above.  He went home and slept and ate for 2d, rested up and has felt fine ever since.  Regarding his anxiety, heels like things are going fine on fluoxetine 40mg  qd.  No panic.  Has not had to use clonaz in over a month. 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  Past Medical History:  Diagnosis Date  . ADD (attention deficit disorder) 10/22/2010  . Adjustment disorder with mixed anxiety and depressed mood 04/2019   Dr. 05/2019 concerned pt worse 05/2018->ween off prozac and start prn clonaz->small rx with close f/u).  . Asthma   . Environmental allergies    environmental--trees, grass, dust: also various metals make his hands break out (claritin 10mg  qd helps this)  . Fatty liver 2012   noted on abd u/s.  AST and ALT mildly elevated 01/2013 and 12/2017.  Viral Hep NEG 12/2017.  01/2018 GERD (gastroesophageal reflux disease)   . Male pattern baldness    finasteride 1mg  qd  . Obesity (BMI 30.0-34.9)     Past Surgical History:  Procedure  Laterality Date  . no surgery    . VASECTOMY      Outpatient Medications Prior to Visit  Medication Sig Dispense Refill  . aluminum chloride (DRYSOL) 20 % external solution Apply qhs, and gradually decrease to 1-2 applications per week as sweating decreases (Patient not taking: Reported on 12/27/2019) 35 mL 6  . clonazePAM (KLONOPIN) 0.5 MG tablet 1-2 tabs po bid prn anxiety. (Patient not taking: Reported on 10/05/2019) 60 tablet 1  . finasteride (PROPECIA) 1 MG tablet Take 1 tablet (1 mg total) by mouth daily. 90 tablet 3  . FLUoxetine (PROZAC) 40 MG capsule Take 1 capsule (40 mg total) by mouth daily. 90 capsule 1  . loratadine (CLARITIN) 10 MG tablet Take 10 mg by mouth daily.    . Multiple Vitamins-Minerals (MULTIVITAMIN GUMMIES MENS PO) Take by mouth.    09-[birth year]1992 omeprazole (PRILOSEC) 40 MG capsule TAKE ONE CAPSULE BY MOUTH EVERY MORNING 90 capsule 0  . sildenafil (REVATIO) 20 MG tablet TAKE 2 TABLETS BY MOUTH DAILY AS NEEDED 30 MINUTES PRIOR TO INTERCOURSE 30 tablet 2   No facility-administered medications prior to visit.    No Known Allergies  ROS As per HPI  PE: Vitals with BMI 12/27/2019 12/18/2019 10/05/2019  Height 6\' 1"  6\' 1"  6\' 1"   Weight 233 lbs 6 oz 253 lbs 8 oz 229 lbs  6 oz  BMI 30.8 33.46 30.27  Systolic 155 112 119  Diastolic 94 78 70  Pulse 59 92 59  O2 sat on RA today is 96%  Gen: Alert, well appearing.  Patient is oriented to person, place, time, and situation. AFFECT: pleasant, lucid thought and speech. JYN:WGNF: no injection, icteris, swelling, or exudate.  EOMI, PERRLA. Mouth: lips without lesion/swelling.  Oral mucosa pink and moist. Oropharynx without erythema, exudate, or swelling.  CV: RRR, no m/r/g.   LUNGS: CTA bilat, nonlabored resps, good aeration in all lung fields. EXT: no clubbing or cyanosis.  no edema.  SKIN: no rash, pallor, or jaundice  LABS:    Chemistry      Component Value Date/Time   NA 136 12/18/2019 2234   K 3.9 12/18/2019 2234   CL 100  12/18/2019 2234   CO2 23 12/18/2019 2234   BUN 17 12/18/2019 2234   CREATININE 1.27 (H) 12/18/2019 2234   CREATININE 0.77 02/03/2013 0816      Component Value Date/Time   CALCIUM 10.1 12/18/2019 2234   ALKPHOS 63 10/05/2019 0901   AST 35 10/05/2019 0901   ALT 75 (H) 10/05/2019 0901   BILITOT 0.6 10/05/2019 0901     Lab Results  Component Value Date   WBC 10.7 (H) 12/18/2019   HGB 15.5 12/18/2019   HCT 44.3 12/18/2019   MCV 85.9 12/18/2019   PLT 222 12/18/2019    IMPRESSION AND PLAN:  Heat exhaustion: resolved with two days of complete rest and rehydration. Discussed sCr slightly elevated in ED, likely prerenal azotemia.  We agreed that it was not absolutely necessary that we recheck this lab today so he chose to decline it.  GAD, hx of panic: he will continue the prozac 40mg  qd, has clonaz to take prn severe worsening. No new rx was given today. PMP AWARE reviewed today: most recent rx for clonaz was filled 08/12/19, # 60, rx by me. No red flags.  An After Visit Summary was printed and given to the patient.  FOLLOW UP: Return for May 2022 for CPE.  Signed:  June 2022, MD           12/27/2019

## 2020-01-20 ENCOUNTER — Other Ambulatory Visit: Payer: Self-pay

## 2020-01-20 NOTE — Telephone Encounter (Signed)
Requesting: Clonazepam Contract: 10/05/19 UDS:n/a Last Visit:12/27/19 Next Visit:04/07/20 Last Refill:06/18/19(60,1)  Please Advise. Medication pending

## 2020-01-21 MED ORDER — CLONAZEPAM 0.5 MG PO TABS: ORAL_TABLET | ORAL | 1 refills | Status: DC

## 2020-02-24 ENCOUNTER — Telehealth: Payer: Self-pay

## 2020-02-24 NOTE — Telephone Encounter (Signed)
Patient wife Nicole(DPR)calling for patient.  Requesting for steroid injection or meds, was not sure of what is normally given to patient regarding seasonal allergies, about this time every year, he has allergies, she reports patients get steroids and is fine afterwards.  I told her that I thought it would require an office visit but she requested to ask Dr. Milinda Cave. I told her call back would not be until tomorrow - she called at 4:58PM.  She was totally fine with having someone respond tomorrow. Joni Reining can be reached at [phone].

## 2020-02-25 NOTE — Telephone Encounter (Signed)
Wife called back regarding virtual visit with Dr. Milinda Cave today.  Gary Griffith) stated that husband tried Afrin overnight and is doing better, he will call office next week if he is not better.  Closed note.

## 2020-02-25 NOTE — Telephone Encounter (Signed)
Please follow up with patient for VV this afternoon

## 2020-03-13 ENCOUNTER — Other Ambulatory Visit: Payer: Self-pay

## 2020-03-13 MED ORDER — OMEPRAZOLE 40 MG PO CPDR: 40.0000 mg | DELAYED_RELEASE_CAPSULE | Freq: Every morning | ORAL | 0 refills | Status: DC

## 2020-04-07 ENCOUNTER — Other Ambulatory Visit: Payer: Self-pay

## 2020-04-07 ENCOUNTER — Ambulatory Visit: Payer: 59 | Admitting: Family Medicine

## 2020-04-07 ENCOUNTER — Encounter: Payer: Self-pay | Admitting: Family Medicine

## 2020-04-07 VITALS — BP 130/74 | HR 50 | Temp 97.5°F | Resp 16 | Ht 73.0 in | Wt 239.6 lb

## 2020-04-07 DIAGNOSIS — F411 Generalized anxiety disorder: Secondary | ICD-10-CM

## 2020-04-07 MED ORDER — ALUMINUM CHLORIDE 20 % EX SOLN: CUTANEOUS | 6 refills | Status: DC

## 2020-04-07 NOTE — Progress Notes (Signed)
OFFICE VISIT  04/07/2020  CC:  Chief Complaint  Patient presents with  . Follow-up    GAD    HPI:    Patient is a 34 y.o. Caucasian male who presents for f/u GAD. 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 Anxiety level very reasonable, no panic or horrible days. Sometimes sleep is poor, occ takes clonaz for this, most recent dose about a week ago, takes avg of 4 per month.   PMP AWARE reviewed today: most recent rx for clonazepam 0.5mg  was filled 01/21/20, # 60, rx by me. No red flags.   Past Medical History:  Diagnosis Date  . ADD (attention deficit disorder) 10/22/2010  . Adjustment disorder with mixed anxiety and depressed mood 04/2019   Dr. Mingo Amber concerned pt worse 05/2018->ween off prozac and start prn clonaz->small rx with close f/u).  . Asthma   . Environmental allergies    environmental--trees, grass, dust: also various metals make his hands break out (claritin 10mg  qd helps this)  . Fatty liver 2012   noted on abd u/s.  AST and ALT mildly elevated 01/2013 and 12/2017.  Viral Hep NEG 12/2017.  01/2018 GERD (gastroesophageal reflux disease)   . Male pattern baldness    finasteride 1mg  qd  . Obesity (BMI 30.0-34.9)     Past Surgical History:  Procedure Laterality Date  . no surgery    . VASECTOMY      Outpatient Medications Prior to Visit  Medication Sig Dispense Refill  . clonazePAM (KLONOPIN) 0.5 MG tablet 1-2 tabs po bid prn anxiety. 60 tablet 1  . finasteride (PROPECIA) 1 MG tablet Take 1 tablet (1 mg total) by mouth daily. 90 tablet 3  . FLUoxetine (PROZAC) 40 MG capsule Take 1 capsule (40 mg total) by mouth daily. 90 capsule 1  . loratadine (CLARITIN) 10 MG tablet Take 10 mg by mouth daily.    . Multiple Vitamins-Minerals (MULTIVITAMIN GUMMIES MENS PO) Take by mouth.    omeprazole (PRILOSEC) 40 MG capsule Take 1 capsule (40 mg total) by mouth every morning. 90 capsule 0  . sildenafil (REVATIO) 20 MG tablet TAKE 2 TABLETS  BY MOUTH DAILY AS NEEDED 30 MINUTES PRIOR TO INTERCOURSE 30 tablet 2  . aluminum chloride (DRYSOL) 20 % external solution Apply qhs, and gradually decrease to 1-2 applications per week as sweating decreases (Patient not taking: Reported on 12/27/2019) 35 mL 6   No facility-administered medications prior to visit.    No Known Allergies  ROS As per HPI  PE: Vitals with BMI 04/07/2020 12/27/2019 12/18/2019  Height 6\' 1"  6\' 1"  6\' 1"   Weight 239 lbs 10 oz 233 lbs 6 oz 253 lbs 8 oz  BMI 31.62 30.8 33.46  Systolic 130 155 02/26/2020  Diastolic 74 94 78  Pulse 50 59 92   Gen: Alert, well appearing.  Patient is oriented to person, place, time, and situation. AFFECT: pleasant, lucid thought and speech. CV: RRR, no m/r/g.   LUNGS: CTA bilat, nonlabored resps, good aeration in all lung fields.   LABS:    Chemistry      Component Value Date/Time   NA 136 12/18/2019 2234   K 3.9 12/18/2019 2234   CL 100 12/18/2019 2234   CO2 23 12/18/2019 2234   BUN 17 12/18/2019 2234   CREATININE 1.27 (H) 12/18/2019 2234   CREATININE 0.77 02/03/2013 0816      Component Value Date/Time   CALCIUM 10.1 12/18/2019 2234   ALKPHOS 63  10/05/2019 0901   AST 35 10/05/2019 0901   ALT 75 (H) 10/05/2019 0901   BILITOT 0.6 10/05/2019 0901     Lab Results  Component Value Date   WBC 10.7 (H) 12/18/2019   HGB 15.5 12/18/2019   HCT 44.3 12/18/2019   MCV 85.9 12/18/2019   PLT 222 12/18/2019   IMPRESSION AND PLAN:  GAD: doing well. Continue fluoxetine 40mg  qd. Using clonaz pretty sparingly and he can continue to do this. CSC UTD. No RF of fluox or clonaz needed today.  I did RF his drysol for his hyperhydrosis per his request today.  An After Visit Summary was printed and given to the patient.  FOLLOW UP: Return for 7 mo for CPE/f/u anxiety.  Signed:  , MD           04/07/2020

## 2020-04-20 ENCOUNTER — Other Ambulatory Visit: Payer: Self-pay

## 2020-04-20 NOTE — Telephone Encounter (Signed)
Patient advised that medications prescribed in the past that are no longer on current med list still have to be approved by prescriber. During 07/27/19 appt, recommendations given were "Buy "SITZ BATH" over the counter to soak your bottom in for 10-15 minutes once a day". This option was recommended and he was offered to try to get an appt with another provider. Will send request to PCP and pend med requesting

## 2020-04-20 NOTE — Telephone Encounter (Signed)
Patient called requesting a refill on Proctofoam.  Patient advised Dr. Milinda Cave is out of the office currently.  He stated provider prescribed this med in the past and is asking for another refill.  [facility] [address].

## 2020-04-20 NOTE — Telephone Encounter (Signed)
Medication requested pending

## 2020-04-23 MED ORDER — HYDROCORT-PRAMOXINE (PERIANAL) 1-1 % EX FOAM: 1.0000 | Freq: Three times a day (TID) | CUTANEOUS | 1 refills | Status: DC

## 2020-04-24 NOTE — Telephone Encounter (Signed)
Patient was notified med was sent.

## 2020-05-01 ENCOUNTER — Telehealth: Payer: Self-pay

## 2020-05-01 NOTE — Telephone Encounter (Signed)
Wife calling back to check status of her husband could get infusion today with clinic today. Message sent as high priority because of trying to get patient scheduled asap.  I told her no one has addressed her response, and someone will be giving her a call shortly. Apologized for the delay.

## 2020-05-01 NOTE — Telephone Encounter (Signed)
Patient wife Gary Griffith [facility]) calling in because husband was just diagnosed with COVID, symptoms started on 04/26/20. She reports coughing,head/nose congestion  She is requesting infusion.  She heard that [facility] was doing iv fluids and such for COVID patients  Please call Gary Griffith [phone]

## 2020-05-01 NOTE — Telephone Encounter (Signed)
Patient's wife contacted and advised message would be sent to [facility] infusion team regarding having patient scheduled but may take up to 24-48 hours to be contacted. She voiced understanding.

## 2020-05-02 ENCOUNTER — Telehealth: Payer: Self-pay | Admitting: Nurse Practitioner

## 2020-05-02 ENCOUNTER — Other Ambulatory Visit: Payer: Self-pay | Admitting: Nurse Practitioner

## 2020-05-02 ENCOUNTER — Encounter: Payer: Self-pay | Admitting: Nurse Practitioner

## 2020-05-02 DIAGNOSIS — U071 COVID-19: Secondary | ICD-10-CM

## 2020-05-02 NOTE — Progress Notes (Signed)
Called to Discuss with patient about Covid symptoms and the use of the monoclonal antibody infusion for those with mild to moderate Covid symptoms and at a high risk of hospitalization.     Pt appears to qualify for this infusion due to co-morbid conditions and/or a member of an at-risk group in accordance with the FDA Emergency Use Authorization.    Scheduled for infusion for 1030 on 05/04/2020.

## 2020-05-02 NOTE — Telephone Encounter (Signed)
Called to Discuss with patient about Covid symptoms and the use of a monoclonal antibody infusion for those with mild to moderate Covid symptoms and at a high risk of hospitalization.     Pt is qualified for this infusion at the [facility] due to co-morbid conditions and/or a member of an at-risk group.     Unable to reach pt. Left message to return call. Sent mychart message.   Timur Nibert, DNP, AGNP-C [phone] (Infusion Center Hotline)  

## 2020-05-04 ENCOUNTER — Ambulatory Visit (HOSPITAL_COMMUNITY)
Admission: RE | Admit: 2020-05-04 | Discharge: 2020-05-04 | Disposition: A | Discharge status: Home / Self Care | Payer: 59 | Source: Ambulatory Visit | Attending: Pulmonary Disease | Admitting: Pulmonary Disease

## 2020-05-04 ENCOUNTER — Other Ambulatory Visit (HOSPITAL_COMMUNITY): Payer: Self-pay

## 2020-05-04 DIAGNOSIS — U071 COVID-19: Secondary | ICD-10-CM | POA: Insufficient documentation

## 2020-05-04 MED ORDER — SODIUM CHLORIDE 0.9 % IV SOLN: Freq: Once | INTRAVENOUS | Status: AC

## 2020-05-04 MED ORDER — DIPHENHYDRAMINE HCL 50 MG/ML IJ SOLN: 50.0000 mg | Freq: Once | INTRAMUSCULAR | Status: DC | PRN

## 2020-05-04 MED ORDER — ALBUTEROL SULFATE HFA 108 (90 BASE) MCG/ACT IN AERS: 2.0000 | INHALATION_SPRAY | Freq: Once | RESPIRATORY_TRACT | Status: DC | PRN

## 2020-05-04 MED ORDER — EPINEPHRINE 0.3 MG/0.3ML IJ SOAJ: 0.3000 mg | Freq: Once | INTRAMUSCULAR | Status: DC | PRN

## 2020-05-04 MED ORDER — METHYLPREDNISOLONE SODIUM SUCC 125 MG IJ SOLR: 125.0000 mg | Freq: Once | INTRAMUSCULAR | Status: DC | PRN

## 2020-05-04 MED ORDER — SODIUM CHLORIDE 0.9 % IV SOLN: INTRAVENOUS | Status: DC | PRN

## 2020-05-04 MED ORDER — FAMOTIDINE IN NACL 20-0.9 MG/50ML-% IV SOLN: 20.0000 mg | Freq: Once | INTRAVENOUS | Status: DC | PRN

## 2020-05-04 NOTE — Progress Notes (Signed)
  Diagnosis: COVID-19  Physician: Dr. Wright   Procedure: Covid Infusion Clinic Med: bamlanivimab\etesevimab infusion - Provided patient with bamlanimivab\etesevimab fact sheet for patients, parents and caregivers prior to infusion.  Complications: No immediate complications noted.  Discharge: Discharged home   Gary Griffith 05/04/2020   

## 2020-05-04 NOTE — Progress Notes (Signed)
Patient reviewed Fact Sheet for Patients, Parents, and Caregivers for Emergency Use Authorization (EUA) of bamlanivimab and etesevimab for the Treatment of Coronavirus. Patient also reviewed and is agreeable to the estimated cost of treatment. Patient is agreeable to proceed.   

## 2020-05-04 NOTE — Discharge Instructions (Signed)
10 Things You Can Do to Manage Your COVID-19 Symptoms at Home If you have possible or confirmed COVID-19: 1. Stay home from work and school. And stay away from other public places. If you must go out, avoid using any kind of public transportation, ridesharing, or taxis. 2. Monitor your symptoms carefully. If your symptoms get worse, call your healthcare provider immediately. 3. Get rest and stay hydrated. 4. If you have a medical appointment, call the healthcare provider ahead of time and tell them that you have or may have COVID-19. 5. For medical emergencies, call 911 and notify the dispatch personnel that you have or may have COVID-19. 6. Cover your cough and sneezes with a tissue or use the inside of your elbow. 7. Wash your hands often with soap and water for at least 20 seconds or clean your hands with an alcohol-based hand sanitizer that contains at least 60% alcohol. 8. As much as possible, stay in a specific room and away from other people in your home. Also, you should use a separate bathroom, if available. If you need to be around other people in or outside of the home, wear a mask. 9. Avoid sharing personal items with other people in your household, like dishes, towels, and bedding. 10. Clean all surfaces that are touched often, like counters, tabletops, and doorknobs. Use household cleaning sprays or wipes according to the label instructions. cdc.gov/coronavirus 11/18/2018 This information is not intended to replace advice given to you by your health care provider. Make sure you discuss any questions you have with your health care provider. Document Revised: 04/22/2019 Document Reviewed: 04/22/2019 Elsevier Patient Education  2020 Elsevier Inc. What types of side effects do monoclonal antibody drugs cause?  Common side effects  In general, the more common side effects caused by monoclonal antibody drugs include: . Allergic reactions, such as hives or itching . Flu-like signs and  symptoms, including chills, fatigue, fever, and muscle aches and pains . Nausea, vomiting . Diarrhea . Skin rashes . Low blood pressure   The CDC is recommending patients who receive monoclonal antibody treatments wait at least 90 days before being vaccinated.  Currently, there are no data on the safety and efficacy of mRNA COVID-19 vaccines in persons who received monoclonal antibodies or convalescent plasma as part of COVID-19 treatment. Based on the estimated half-life of such therapies as well as evidence suggesting that reinfection is uncommon in the 90 days after initial infection, vaccination should be deferred for at least 90 days, as a precautionary measure until additional information becomes available, to avoid interference of the antibody treatment with vaccine-induced immune responses. If you have any questions or concerns after the infusion please call the Advanced Practice Provider on call at [phone]. This number is ONLY intended for your use regarding questions or concerns about the infusion post-treatment side-effects.  Please do not provide this number to others for use. For return to work notes please contact your primary care provider.   If someone you know is interested in receiving treatment please have them call the COVID hotline at [phone].   

## 2020-05-08 ENCOUNTER — Telehealth: Payer: Self-pay | Admitting: Family Medicine

## 2020-05-08 NOTE — Telephone Encounter (Signed)
Patient's wife states he has Covid and got infusion 05/04/20. For a couple days, he felt better but now has frequent diarrhea starting today. She states he is eating well but tired all the time and gets out of breath easily. She would like to know if this is normal progression for the condition.

## 2020-05-09 NOTE — Telephone Encounter (Signed)
Spoke with pt's wife, Tanya(DPR), advised of recommendations.

## 2020-05-09 NOTE — Telephone Encounter (Signed)
Last call received on 12/13, "Patient wife Gary Griffith [facility]) calling in because husband was just diagnosed with COVID, symptoms started on 04/26/20.She reports coughing,head/nose congestion".   Please advise, thanks.

## 2020-05-09 NOTE — Telephone Encounter (Signed)
Certainly all of these symptoms can be expected in the context of recent covid infection. Treat with otc imodium for diarrhea.  Rest, hydrate, otc cold/cough meds as needed. If gets fevers or SOB or dehydration then needs to be seen.-thx

## 2020-05-23 ENCOUNTER — Other Ambulatory Visit: Payer: Self-pay | Admitting: Family Medicine

## 2020-08-11 ENCOUNTER — Other Ambulatory Visit: Payer: Self-pay

## 2020-08-11 ENCOUNTER — Encounter: Payer: Self-pay | Admitting: Family Medicine

## 2020-08-11 ENCOUNTER — Ambulatory Visit (INDEPENDENT_AMBULATORY_CARE_PROVIDER_SITE_OTHER): Payer: 59 | Admitting: Family Medicine

## 2020-08-11 VITALS — BP 124/81 | HR 58 | Temp 97.9°F | Ht 73.0 in | Wt 237.8 lb

## 2020-08-11 DIAGNOSIS — J310 Chronic rhinitis: Secondary | ICD-10-CM | POA: Diagnosis not present

## 2020-08-11 DIAGNOSIS — H8111 Benign paroxysmal vertigo, right ear: Secondary | ICD-10-CM

## 2020-08-11 DIAGNOSIS — J329 Chronic sinusitis, unspecified: Secondary | ICD-10-CM | POA: Diagnosis not present

## 2020-08-11 MED ORDER — METHYLPREDNISOLONE ACETATE 80 MG/ML IJ SUSP
80.0000 mg | Freq: Once | INTRAMUSCULAR | Status: AC
  Administered 2020-08-11: 80 mg via INTRAMUSCULAR

## 2020-08-11 NOTE — Progress Notes (Signed)
OFFICE VISIT  08/11/2020  CC:  Chief Complaint  Patient presents with  . Dizziness    Saw teledoc yesterday and meclizine,   . Nasal Congestion    With ear ache and allergies     HPI:    Patient is a 35 y.o. Caucasian male who presents accompanied by his wife for "vertigo". Three weeks of nasal congestion/sinus fullness, PND, taking lots of mucinex D. No face pain or teeth pain.  Head feels full/puffy. Some vicks decongestant spray for a few days. No fever or cough or wheeze. No persistent ear pain.  Says gets spring allergies quite bad each year. Takes zyrtec alt w claritin.  No steroid nasal spray or saline.  Yesterday was crawling around under a home in crawlspace and started feeling spinning sensation.  No nausea.  No hearing changes, no ringing in ears. No palpitations.   Describes dizziness sensation at times "like I'm drunk". Wife says she saw pt's eyes moving back and forth quickly and pt felt them doing this as well.  Saw [facility]'s teled[facility] yesterday, was rx'd meclizine, took last night but no help.  ROS as above, plus--> no fevers, no CP, no SOB, no wheezing, no cough, no HAs, no rashes, no melena/hematochezia.  No polyuria or polydipsia.  No myalgias or arthralgias.  No focal weakness, paresthesias, or tremors.  No acute vision or hearing abnormalities.  No dysuria or unusual/new urinary urgency or frequency.  No recent changes in lower legs. No n/v/d or abd pain.  No palpitations.    Past Medical History:  Diagnosis Date  . ADD (attention deficit disorder) 10/22/2010  . Adjustment disorder with mixed anxiety and depressed mood 04/2019   Dr. Mingo Amber concerned pt worse 05/2018->ween off prozac and start prn clonaz->small rx with close f/u).  . Asthma   . Environmental allergies    environmental--trees, grass, dust: also various metals make his hands break out (claritin 10mg  qd helps this)  . Fatty liver 2012   noted on abd u/s.  AST and ALT mildly elevated  01/2013 and 12/2017.  Viral Hep NEG 12/2017.  01/2018 GERD (gastroesophageal reflux disease)   . Male pattern baldness    finasteride 1mg  qd  . Obesity (BMI 30.0-34.9)     Past Surgical History:  Procedure Laterality Date  . no surgery    . VASECTOMY      Outpatient Medications Prior to Visit  Medication Sig Dispense Refill  . aluminum chloride (DRYSOL) 20 % external solution Apply qhs, and gradually decrease to 1-2 applications per week as sweating decreases 35 mL 6  . cetirizine (ZYRTEC) 10 MG tablet Take 10 mg by mouth daily.    . clonazePAM (KLONOPIN) 0.5 MG tablet 1-2 tabs po bid prn anxiety. 60 tablet 1  . finasteride (PROPECIA) 1 MG tablet Take 1 tablet (1 mg total) by mouth daily. 90 tablet 3  . FLUoxetine (PROZAC) 40 MG capsule TAKE ONE CAPSULE BY MOUTH DAILY 90 capsule 1  . hydrocortisone-pramoxine (PROCTOFOAM-HC) rectal foam Place 1 applicator rectally 3 (three) times daily. 10 g 1  . meclizine (ANTIVERT) 12.5 MG tablet Take 12.5 mg by mouth 3 (three) times daily as needed for dizziness.    . Multiple Vitamins-Minerals (MULTIVITAMIN GUMMIES MENS PO) Take by mouth.    omeprazole (PRILOSEC) 40 MG capsule TAKE ONE CAPSULE BY MOUTH EVERY MORNING 90 capsule 1  . sildenafil (REVATIO) 20 MG tablet TAKE 2 TABLETS BY MOUTH DAILY AS NEEDED 30 MINUTES PRIOR TO INTERCOURSE 30 tablet 2  .  loratadine (CLARITIN) 10 MG tablet Take 10 mg by mouth daily. (Patient not taking: Reported on 08/11/2020)     No facility-administered medications prior to visit.    No Known Allergies  ROS As per HPI  PE: Vitals with BMI 08/11/2020 05/04/2020 05/04/2020  Height 6\' 1"  - -  Weight 237 lbs 13 oz - -  BMI 31.38 - -  Systolic 124 117  Diastolic 81 62 85  Pulse 58 51 64   Gen: Alert, well appearing.  Patient is oriented to person, place, time, and situation. AFFECT: pleasant, lucid thought and speech. 758: no injection, icteris, swelling, or exudate.  EOMI, PERRLA. Mouth: lips without  lesion/swelling.  Oral mucosa pink and moist. Oropharynx without erythema, exudate, or swelling.  EACS clear, TMS normal, no fluid in middle ear. Nose with pretty normal-appearing nasal mucosa/turbinates. CV: RRR, no m/r/g.   LUNGS: CTA bilat, nonlabored resps, good aeration in all lung fields. Neuro: CN 2-12 intact bilaterally, strength 5/5 in proximal and distal upper extremities and lower extremities bilaterally.  No sensory deficits.  No tremor.  No disdiadochokinesis.  No ataxia.  Upper extremity and lower extremity DTRs symmetric.  No pronator drift. Dix Halpike: +vertigo and just a hint of rotary nystagmus with head turned to R. On the left he had no sx's.   LABS:  Lab Results  Component Value Date   TSH 1.30 10/05/2019   Lab Results  Component Value Date   WBC 10.7 (H) 12/18/2019   HGB 15.5 12/18/2019   HCT 44.3 12/18/2019   MCV 85.9 12/18/2019   PLT 222 12/18/2019   Lab Results  Component Value Date   CREATININE 1.27 (H) 12/18/2019   BUN 17 12/18/2019   NA 136 12/18/2019   K 3.9 12/18/2019   CL 100 12/18/2019   CO2 23 12/18/2019   Lab Results  Component Value Date   ALT 75 (H) 10/05/2019   AST 35 10/05/2019   ALKPHOS 63 10/05/2019   BILITOT 0.6 10/05/2019   Lab Results  Component Value Date   CHOL 192 10/05/2019   Lab Results  Component Value Date   HDL 43.60 10/05/2019   Lab Results  Component Value Date   LDLCALC 112 (H) 10/05/2019   Lab Results  Component Value Date   TRIG 185.0 (H) 10/05/2019   Lab Results  Component Value Date   CHOLHDL 4 10/05/2019   Lab Results  Component Value Date   HGBA1C 5.2 12/24/2017   IMPRESSION AND PLAN:  1) BPPV: discussed dx, home epley maneuvers, expected course, etc. Ok to use meclizine prn that was rx'd by tel[facility].  2) Allergic rhinosinusitis: he says he gets better with steroid inj x 1 "each year", so I did go ahead and give 80mg  depo medrol IM today.  No prednisone. Recommended he start daily flonase  and saline nasal spray. Counseled on potential adverse effects of otc nasal sprays like afrin/vicks.  An After Visit Summary was printed and given to the patient.  FOLLOW UP: Return if symptoms worsen or fail to improve.  Signed:  02/23/2018, MD           08/11/2020

## 2020-08-11 NOTE — Patient Instructions (Signed)
Start otc saline nasal spray 2-3 times a day. Start using otc flonase nasal spray every day and continue through May this year.

## 2020-08-28 DIAGNOSIS — M79672 Pain in left foot: Secondary | ICD-10-CM | POA: Insufficient documentation

## 2020-09-21 ENCOUNTER — Telehealth: Payer: Self-pay | Admitting: Family Medicine

## 2020-09-21 NOTE — Telephone Encounter (Signed)
LM for pt to return call. Pt was last seen 08/11/20, advised to "Start otc saline nasal spray 2-3 times a day. Start using otc flonase nasal spray every day and continue through May this year".

## 2020-09-21 NOTE — Telephone Encounter (Signed)
Pt called and said that he is having allergy problems again, sinus pressure, mucus in chest and throat and said that he had got a steroid shot a few months back and wanted to know if he can get one again and wasn't sure if there was a time frame he had to wait in between. He took a covid test to be sure and it came back negative

## 2020-09-25 NOTE — Telephone Encounter (Signed)
Please assist patient with scheduling, thanks. 

## 2020-09-25 NOTE — Telephone Encounter (Signed)
Pt states he got better over the weekend and is doing recommended nasal sprays. Advised pt to call and schedule if problems begin again.

## 2020-10-24 ENCOUNTER — Other Ambulatory Visit: Payer: Self-pay | Admitting: Family Medicine

## 2020-11-22 ENCOUNTER — Other Ambulatory Visit: Payer: Self-pay | Admitting: Family Medicine

## 2020-11-22 NOTE — Telephone Encounter (Signed)
Requesting: clonazepam Contract: 10/05/19 UDS: n/a Last Visit:08/11/20, acute; 04/07/20 f/u [facility] Next Visit: advised to f/u 7 mo. Last Refill: 01/21/20(60,1)  Please Advise. Medication pending

## 2020-11-22 NOTE — Telephone Encounter (Signed)
Spoke with pt regarding refill, offered to schedule appt. Appt scheduled for follow up

## 2020-12-05 ENCOUNTER — Ambulatory Visit: Payer: 59 | Admitting: Family Medicine

## 2020-12-12 ENCOUNTER — Other Ambulatory Visit: Payer: Self-pay | Admitting: Family Medicine

## 2020-12-14 ENCOUNTER — Other Ambulatory Visit: Payer: Self-pay

## 2020-12-14 ENCOUNTER — Ambulatory Visit (INDEPENDENT_AMBULATORY_CARE_PROVIDER_SITE_OTHER): Payer: 59 | Admitting: Family Medicine

## 2020-12-14 ENCOUNTER — Encounter: Payer: Self-pay | Admitting: Family Medicine

## 2020-12-14 VITALS — BP 100/67 | HR 62 | Temp 98.1°F | Wt 238.0 lb

## 2020-12-14 DIAGNOSIS — F411 Generalized anxiety disorder: Secondary | ICD-10-CM

## 2020-12-14 DIAGNOSIS — K219 Gastro-esophageal reflux disease without esophagitis: Secondary | ICD-10-CM

## 2020-12-14 DIAGNOSIS — L649 Androgenic alopecia, unspecified: Secondary | ICD-10-CM | POA: Diagnosis not present

## 2020-12-14 DIAGNOSIS — Z23 Encounter for immunization: Secondary | ICD-10-CM | POA: Diagnosis not present

## 2020-12-14 DIAGNOSIS — Z79899 Other long term (current) drug therapy: Secondary | ICD-10-CM

## 2020-12-14 MED ORDER — OMEPRAZOLE 40 MG PO CPDR: 40.0000 mg | DELAYED_RELEASE_CAPSULE | Freq: Every morning | ORAL | 3 refills | Status: DC

## 2020-12-14 MED ORDER — SILDENAFIL CITRATE 20 MG PO TABS: ORAL_TABLET | ORAL | 2 refills | Status: DC

## 2020-12-14 MED ORDER — FLUOXETINE HCL 40 MG PO CAPS: 40.0000 mg | ORAL_CAPSULE | Freq: Every day | ORAL | 3 refills | Status: DC

## 2020-12-14 MED ORDER — ALUMINUM CHLORIDE 20 % EX SOLN: CUTANEOUS | 6 refills | Status: DC

## 2020-12-14 MED ORDER — FINASTERIDE 1 MG PO TABS: 1.0000 mg | ORAL_TABLET | Freq: Every day | ORAL | 3 refills | Status: DC

## 2020-12-14 MED ORDER — HYDROCORT-PRAMOXINE (PERIANAL) 1-1 % EX FOAM: 1.0000 | Freq: Three times a day (TID) | CUTANEOUS | 1 refills | Status: DC

## 2020-12-14 NOTE — Progress Notes (Signed)
OFFICE VISIT  12/14/2020  CC:  Chief Complaint  Patient presents with   Anxiety    [facility]CI; f/u    HPI:    Patient is a 35 y.o. Caucasian male who presents for f/u GAD. I last saw him for this about 8 mo ago. A/P as of that visit: "GAD: doing well. Continue fluoxetine 40mg  qd. Using clonaz pretty sparingly and he can continue to do this. CSC UTD. No RF of fluox or clonaz needed today.   I did RF his drysol for his hyperhydrosis per his request today"  INTERIM HX: Doing well overall.  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 GF died 1 mo ago, he describes approp grief and feels like he's through it now.  Was having some inc anxiety during that time, felt keyed up, was drinking alcohol regularly.  Now no alcohol in the last week, feels well emotionally and physically.  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 GERD: takes omeprazole 40 qd and if misses a dose he feels very uncomfortable GER sx's.  Fluoxetine 40mg  qd. Takes clonaz 0.5mg  1 on most days, rarely 2 in a day. PMP AWARE reviewed today: most recent rx for clonazepam 0.5mg  was filled 11/22/20, # 60, rx by me. No red flags.  ROS as above, plus--> no fevers, no CP, no SOB, no wheezing, no cough, no dizziness, no HAs, no rashes, no melena/hematochezia.  No polyuria or polydipsia.  No myalgias or arthralgias.  No focal weakness, paresthesias, or tremors.  No acute vision or hearing abnormalities.  No dysuria or unusual/new urinary urgency or frequency.  No recent changes in lower legs. No n/v/d or abd pain.  No palpitations.     Past Medical History:  Diagnosis Date   ADD (attention deficit disorder) 10/22/2010   Adjustment disorder with mixed anxiety and depressed mood 04/2019   Dr. 12/22/2010 concerned pt worse 05/2018->ween off prozac and start prn clonaz->small rx with close f/u).   Asthma    Environmental allergies    environmental--trees, grass, dust: also various metals make his hands break out (claritin  10mg  qd helps this)   Fatty liver 2012   noted on abd u/s.  AST and ALT mildly elevated 01/2013 and 12/2017.  Viral Hep NEG 12/2017.   GERD (gastroesophageal reflux disease)    Male pattern baldness    finasteride 1mg  qd   Obesity (BMI 30.0-34.9)     Past Surgical History:  Procedure Laterality Date   no surgery     VASECTOMY      Outpatient Medications Prior to Visit  Medication Sig Dispense Refill   clonazePAM (KLONOPIN) 0.5 MG tablet TAKE 1 TO 2 TABLETS BY MOUTH TWO TIMES A DAY AS NEEDED FOR ANXIETY 60 tablet 1   loratadine (CLARITIN) 10 MG tablet Take 10 mg by mouth daily.     Multiple Vitamins-Minerals (MULTIVITAMIN GUMMIES MENS PO) Take by mouth.     aluminum chloride (DRYSOL) 20 % external solution Apply qhs, and gradually decrease to 1-2 applications per week as sweating decreases 35 mL 6   cetirizine (ZYRTEC) 10 MG tablet Take 10 mg by mouth daily.     finasteride (PROPECIA) 1 MG tablet TAKE ONE TABLET BY MOUTH DAILY 90 tablet 0   FLUoxetine (PROZAC) 40 MG capsule TAKE ONE CAPSULE BY MOUTH DAILY 90 capsule 1   hydrocortisone-pramoxine (PROCTOFOAM-HC) rectal foam Place 1 applicator rectally 3 (three) times daily. 10 g 1   omeprazole (PRILOSEC) 40 MG capsule TAKE ONE CAPSULE BY MOUTH EVERY MORNING  90 capsule 1   sildenafil (REVATIO) 20 MG tablet TAKE 2 TABLETS BY MOUTH DAILY AS NEEDED 30 MINUTES PRIOR TO INTERCOURSE 30 tablet 2   meclizine (ANTIVERT) 12.5 MG tablet Take 12.5 mg by mouth 3 (three) times daily as needed for dizziness. (Patient not taking: Reported on 12/14/2020)     No facility-administered medications prior to visit.    No Known Allergies  ROS As per HPI  PE: Vitals with BMI 12/14/2020 08/11/2020 05/04/2020  Height - 6\' 1"  -  Weight 238 lbs 237 lbs 13 oz -  BMI - 31.38 -  Systolic 100 124  Diastolic 67 81 62  Pulse 62 58 51     Wt Readings from Last 2 Encounters:  12/14/20 238 lb (108 kg)  08/11/20 237 lb 12.8 oz (107.9 kg)    Gen: alert,  oriented x 4, affect pleasant.  Lucid thinking and conversation noted. HEENT: PERRLA, EOMI.   Neck: no LAD, mass, or thyromegaly. CV: RRR, no m/r/g LUNGS: CTA bilat, nonlabored. NEURO: no tremor or tics noted on observation.  Coordination intact. CN 2-12 grossly intact bilaterally, strength 5/5 in all extremeties.  No ataxia.   LABS:    Chemistry      Component Value Date/Time   NA 136 12/18/2019 2234   K 3.9 12/18/2019 2234   CL 100 12/18/2019 2234   CO2 23 12/18/2019 2234   BUN 17 12/18/2019 2234   CREATININE 1.27 (H) 12/18/2019 2234   CREATININE 0.77 02/03/2013 0816      Component Value Date/Time   CALCIUM 10.1 12/18/2019 2234   ALKPHOS 63 10/05/2019 0901   AST 35 10/05/2019 0901   ALT 75 (H) 10/05/2019 0901   BILITOT 0.6 10/05/2019 0901     Lab Results  Component Value Date   WBC 10.7 (H) 12/18/2019   HGB 15.5 12/18/2019   HCT 44.3 12/18/2019   MCV 85.9 12/18/2019   PLT 222 12/18/2019    IMPRESSION AND PLAN:  GAD, doing well s/p a period of grieving recently d/t his GF passing away. He's doing well now.  Plan is to stay on fluoxetine 40mg  qd indefinitely. Cont clonaz 0.5mg , 1-2 bid prn.  No new rx was needed for this med today. CSC updated today.  2) GERD: stable on daily omeprazole 40mg .  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  An After Visit Summary was printed and given to the patient.  FOLLOW UP: Return in about 6 months (around 06/16/2021) for annual CPE (fasting).  Signed:  , MD           12/14/2020

## 2020-12-14 NOTE — Addendum Note (Signed)
Addended by: Maxie Barb on: 12/14/2020 03:17 PM   Modules accepted: Orders

## 2021-02-24 ENCOUNTER — Encounter (HOSPITAL_BASED_OUTPATIENT_CLINIC_OR_DEPARTMENT_OTHER): Payer: Self-pay | Admitting: Emergency Medicine

## 2021-02-24 ENCOUNTER — Emergency Department (HOSPITAL_BASED_OUTPATIENT_CLINIC_OR_DEPARTMENT_OTHER): Payer: 59 | Admitting: Radiology

## 2021-02-24 ENCOUNTER — Emergency Department (HOSPITAL_BASED_OUTPATIENT_CLINIC_OR_DEPARTMENT_OTHER)
Admission: EM | Admit: 2021-02-24 | Discharge: 2021-02-24 | Disposition: A | Discharge status: Home / Self Care | Payer: 59 | Attending: Emergency Medicine | Admitting: Emergency Medicine

## 2021-02-24 ENCOUNTER — Other Ambulatory Visit: Payer: Self-pay

## 2021-02-24 DIAGNOSIS — W228XXA Striking against or struck by other objects, initial encounter: Secondary | ICD-10-CM | POA: Diagnosis not present

## 2021-02-24 DIAGNOSIS — S6992XA Unspecified injury of left wrist, hand and finger(s), initial encounter: Secondary | ICD-10-CM | POA: Diagnosis present

## 2021-02-24 DIAGNOSIS — S61211A Laceration without foreign body of left index finger without damage to nail, initial encounter: Secondary | ICD-10-CM | POA: Diagnosis not present

## 2021-02-24 DIAGNOSIS — J45909 Unspecified asthma, uncomplicated: Secondary | ICD-10-CM | POA: Insufficient documentation

## 2021-02-24 MED ORDER — CEPHALEXIN 500 MG PO CAPS: 500.0000 mg | ORAL_CAPSULE | Freq: Four times a day (QID) | ORAL | 0 refills | Status: AC

## 2021-02-24 MED ORDER — LIDOCAINE HCL (PF) 1 % IJ SOLN
10.0000 mL | Freq: Once | INTRAMUSCULAR | Status: AC
  Administered 2021-02-24: 10 mL
  Filled 2021-02-24: qty 10

## 2021-02-24 NOTE — ED Provider Notes (Signed)
[facility] EMERGENCY DEPT Provider Note   CSN: [id number] Arrival date & time: 02/24/21  1619     History Chief Complaint  Patient presents with   Extremity Laceration    Left index    Gary Griffith is a 35 y.o. male who presents with a left index finger laceration.  He was using a string trimmer and felt the cord come back and hit him, this occurred around 2:30 PM.  He was seen at urgent care and was told it was a "complicated suture", he should be seen in the emergency department.  It was dressed and bleeding was controlled at that time.  No other complaints at this time.  Last tetanus vaccination 6 months ago.  HPI     Past Medical History:  Diagnosis Date   ADD (attention deficit disorder) 10/22/2010   Adjustment disorder with mixed anxiety and depressed mood 04/2019   Dr. Mingo Amber concerned pt worse 05/2018->ween off prozac and start prn clonaz->small rx with close f/u).   Asthma    Environmental allergies    environmental--trees, grass, dust: also various metals make his hands break out (claritin 10mg  qd helps this)   Fatty liver 2012   noted on abd u/s.  AST and ALT mildly elevated 01/2013 and 12/2017.  Viral Hep NEG 12/2017.   GERD (gastroesophageal reflux disease)    Male pattern baldness    finasteride 1mg  qd   Obesity (BMI 30.0-34.9)     Patient Active Problem List   Diagnosis Date Noted   Pain in left foot 08/28/2020   Acute upper respiratory infection 03/29/2015   Health maintenance examination 06/09/2014   Paronychia 03/03/2014   Eye lesion 11/30/2013   Poison ivy 10/07/2013   Hemorrhoid 10/07/2013   Folliculitis 10/07/2013   Acute upper respiratory infections of unspecified site 06/28/2013   Routine general medical examination at a health care facility 02/01/2013   Erectile dysfunction 02/01/2013   Fatty liver 11/04/2010   Elevated liver function tests 10/31/2010   ADD (attention deficit disorder) 10/22/2010   Asthma 10/03/2010    GERD (gastroesophageal reflux disease) 10/03/2010   Allergic rhinitis 10/03/2010    Past Surgical History:  Procedure Laterality Date   no surgery     VASECTOMY         Family History  Problem Relation Age of Onset   Hypertension Father    Diabetes Maternal Grandmother    Heart disease Maternal Grandmother        MI, stents   Diabetes Paternal Grandfather    Cancer Neg Hx     Social History   Tobacco Use   Smoking status: Never   Smokeless tobacco: Never  Substance Use Topics   Alcohol use: Yes    Alcohol/week: 1.0 standard drink    Types: 1 Cans of beer per week    Comment: w-ends   Drug use: No    Home Medications Prior to Admission medications   Medication Sig Start Date End Date Taking? Authorizing Provider  cephALEXin (KEFLEX) 500 MG capsule Take 1 capsule (500 mg total) by mouth 4 (four) times daily for 7 days. 02/24/21 03/03/21 Yes Clydine Parkison T, PA-C  aluminum chloride (DRYSOL) 20 % external solution Apply qhs, and gradually decrease to 1-2 applications per week as sweating decreases 12/14/20   McGowen, 03/05/21, MD  clonazePAM (KLONOPIN) 0.5 MG tablet TAKE 1 TO 2 TABLETS BY MOUTH TWO TIMES A DAY AS NEEDED FOR ANXIETY 11/22/20   McGowen, Maryjean Morn, MD  finasteride (  PROPECIA) 1 MG tablet Take 1 tablet (1 mg total) by mouth daily. 12/14/20   McGowen, Maryjean Morn, MD  FLUoxetine (PROZAC) 40 MG capsule Take 1 capsule (40 mg total) by mouth daily. 12/14/20   McGowen, Maryjean Morn, MD  hydrocortisone-pramoxine [facility]) rectal foam Place 1 applicator rectally 3 (three) times daily. 12/14/20   McGowen, Maryjean Morn, MD  loratadine (CLARITIN) 10 MG tablet Take 10 mg by mouth daily.    [provider]  Multiple Vitamins-Minerals (MULTIVITAMIN GUMMIES MENS PO) Take by mouth.    [provider]  omeprazole (PRILOSEC) 40 MG capsule Take 1 capsule (40 mg total) by mouth every morning. 12/14/20   McGowen, Maryjean Morn, MD  sildenafil (REVATIO) 20 MG tablet TAKE 2 TABLETS  BY MOUTH DAILY AS NEEDED 30 MINUTES PRIOR TO INTERCOURSE 12/14/20   McGowen, Maryjean Morn, MD    Allergies    Patient has no known allergies.  Review of Systems   Review of Systems  Skin:  Positive for wound.       Left index finger laceration  All other systems reviewed and are negative.  Physical Exam Updated Vital Signs BP 130/80 (BP Location: Right Arm)   Pulse 62   Temp 97.9 F (36.6 C)   Resp 18   Ht 6\' 1"  (1.854 m)   Wt 106.6 kg   SpO2 98%   BMI 31.00 kg/m   Physical Exam Vitals and nursing note reviewed.  Constitutional:      Appearance: Normal appearance.  HENT:     Head: Normocephalic and atraumatic.  Eyes:     Conjunctiva/sclera: Conjunctivae normal.  Pulmonary:     Effort: Pulmonary effort is normal. No respiratory distress.  Musculoskeletal:     Comments: Full passive range of motion of left fingers and left wrist.  Sensation intact.  Good capillary refill.  Skin:    General: Skin is warm and dry.     Comments: Approximately 7 mm laceration over the tip of the left index finger.  No nail damage.  Bleeding is controlled.  Neurological:     Mental Status: He is alert.  Psychiatric:        Mood and Affect: Mood normal.        Behavior: Behavior normal.    ED Results / Procedures / Treatments   Labs (all labs ordered are listed, but only abnormal results are displayed) Labs Reviewed - No data to display  EKG None  Radiology DG Finger Index Left  Result Date: 02/24/2021 CLINICAL DATA:  Laceration. Pt cut tip of finger with hedge clippers today. EXAM: LEFT INDEX FINGER 2+V COMPARISON:  None. FINDINGS: There is no evidence of fracture or dislocation. There is no evidence of arthropathy or other focal bone abnormality. Soft tissue defect of the volar aspect of the distal first digit. No retained radiopaque foreign body. IMPRESSION: 1. No acute displaced fracture or dislocation. 2. No retained radiopaque foreign body. Electronically Signed   By: 04/26/2021 M.D.   On: 02/24/2021 17:13    Procedures .12/08/2022Laceration Repair  Date/Time: 02/24/2021 7:40 PM Performed by: 04/26/2021, PA-C Authorized by: Su Monks, PA-C   Consent:    Consent obtained:  Verbal   Consent given by:  Patient   Risks, benefits, and alternatives were discussed: yes     Risks discussed:  Infection and pain Universal protocol:    Procedure explained and questions answered to patient or proxy's satisfaction: yes     Patient identity confirmed:  Verbally with patient Anesthesia:    Anesthesia method:  Nerve block   Block location:  Left pointer finger   Block needle gauge:  25 G   Block anesthetic:  Lidocaine 1% w/o epi   Block injection procedure:  Negative aspiration for blood and anatomic landmarks palpated   Block outcome:  Anesthesia achieved Laceration details:    Location:  Finger   Finger location:  L index finger   Length (cm):  0.7 Pre-procedure details:    Preparation:  Imaging obtained to evaluate for foreign bodies and patient was prepped and draped in usual sterile fashion Exploration:    Limited defect created (wound extended): no     Hemostasis achieved with:  Direct pressure   Imaging obtained: x-ray     Imaging outcome: foreign body not noted     Wound exploration: entire depth of wound visualized   Treatment:    Area cleansed with:  Povidone-iodine and soap and water   Irrigation solution:  Tap water   Irrigation method:  Tap   Visualized foreign bodies/material removed: no     Debridement:  None Skin repair:    Repair method:  Sutures   Suture size:  6-0   Suture material:  Prolene   Suture technique:  Simple interrupted   Number of sutures:  10 Approximation:    Approximation:  Close Repair type:    Repair type:  Simple Post-procedure details:    Dressing:  Sterile dressing   Procedure completion:  Tolerated .Nerve Block  Date/Time: 02/24/2021 7:58 PM Performed by: Su Monks, PA-C Authorized by:  Su Monks, PA-C   Consent:    Consent obtained:  Verbal   Consent given by:  Patient   Risks, benefits, and alternatives were discussed: yes     Risks discussed:  Unsuccessful block and pain Universal protocol:    Procedure explained and questions answered to patient or proxy's satisfaction: yes     Patient identity confirmed:  Verbally with patient Indications:    Indications:  Procedural anesthesia Location:    Body area:  Upper extremity   Upper extremity nerve blocked: Left pointer digit.   Laterality:  Left Pre-procedure details:    Skin preparation:  Alcohol Procedure details:    Block needle gauge:  25 G   Anesthetic injected:  Lidocaine 1% w/o epi Post-procedure details:    Outcome:  Anesthesia achieved   Procedure completion:  Tolerated   Medications Ordered in ED Medications  lidocaine (PF) (XYLOCAINE) 1 % injection 10 mL (10 mLs Infiltration Given by Other 02/24/21 1845)    ED Course  I have reviewed the triage vital signs and the nursing notes.  Pertinent labs & imaging results that were available during my care of the patient were reviewed by me and considered in my medical decision making (see chart for details).    MDM Rules/Calculators/A&P                           Patient is 35 year old male who presents with a left finger laceration after using a string trimmer earlier today.  Tetanus is up-to-date.  No other trauma.  On exam patient has an approximately 7 mm laceration over the tip of the left index finger.  There is no nail damage. Pressure irrigation performed. Wound explored and base of wound visualized in a bloodless field without evidence of foreign body.  Laceration occurred < 8 hours prior to repair which was well  tolerated.  Tdap up to date.  Pt has  no comorbidities to effect normal wound healing. Pt discharged with antibiotics.  Discussed suture home care with patient and answered questions. Pt to follow-up for wound check and suture  removal in 10 days; they are to return to the ED sooner for signs of infection. Pt is hemodynamically stable with no complaints prior to dc.  Patient agreeable to plan.  Final Clinical Impression(s) / ED Diagnoses Final diagnoses:  Laceration of left index finger without foreign body without damage to nail, initial encounter    Rx / DC Orders ED Discharge Orders          Ordered    cephALEXin (KEFLEX) 500 MG capsule  4 times daily        02/24/21 1947             Jolissa Kapral T, PA-C 02/24/21 2240    Curatolo, Adam, DO 02/24/21 2245

## 2021-02-24 NOTE — Discharge Instructions (Addendum)
You were seen in the emergency department for a laceration repair.   We placed 10 sutures in your finger. I've attached instructions how to best care for this. As we discussed, look out for signs of infection. Present to your primary doctor or urgent care for suture removal in about 10 days.   I've prescribed you antibiotics to take for the next 7 days. It's important to finish the entire course. You can take ibuprofen or tylenol for pain.

## 2021-02-24 NOTE — ED Triage Notes (Signed)
Pt reports left index laceration today , presents with dressing pressure , bleeding controlled . Was seen at [facility] , sent here for "complicated " suture .

## 2021-05-15 IMAGING — DX DG CHEST 2V
2 series · 2 of 2 positions shown · non-contrast
Comparison: None.

CLINICAL DATA: Chest pressure and tightness for 2 days

EXAM:
CHEST - 2 VIEW

[chest pa]
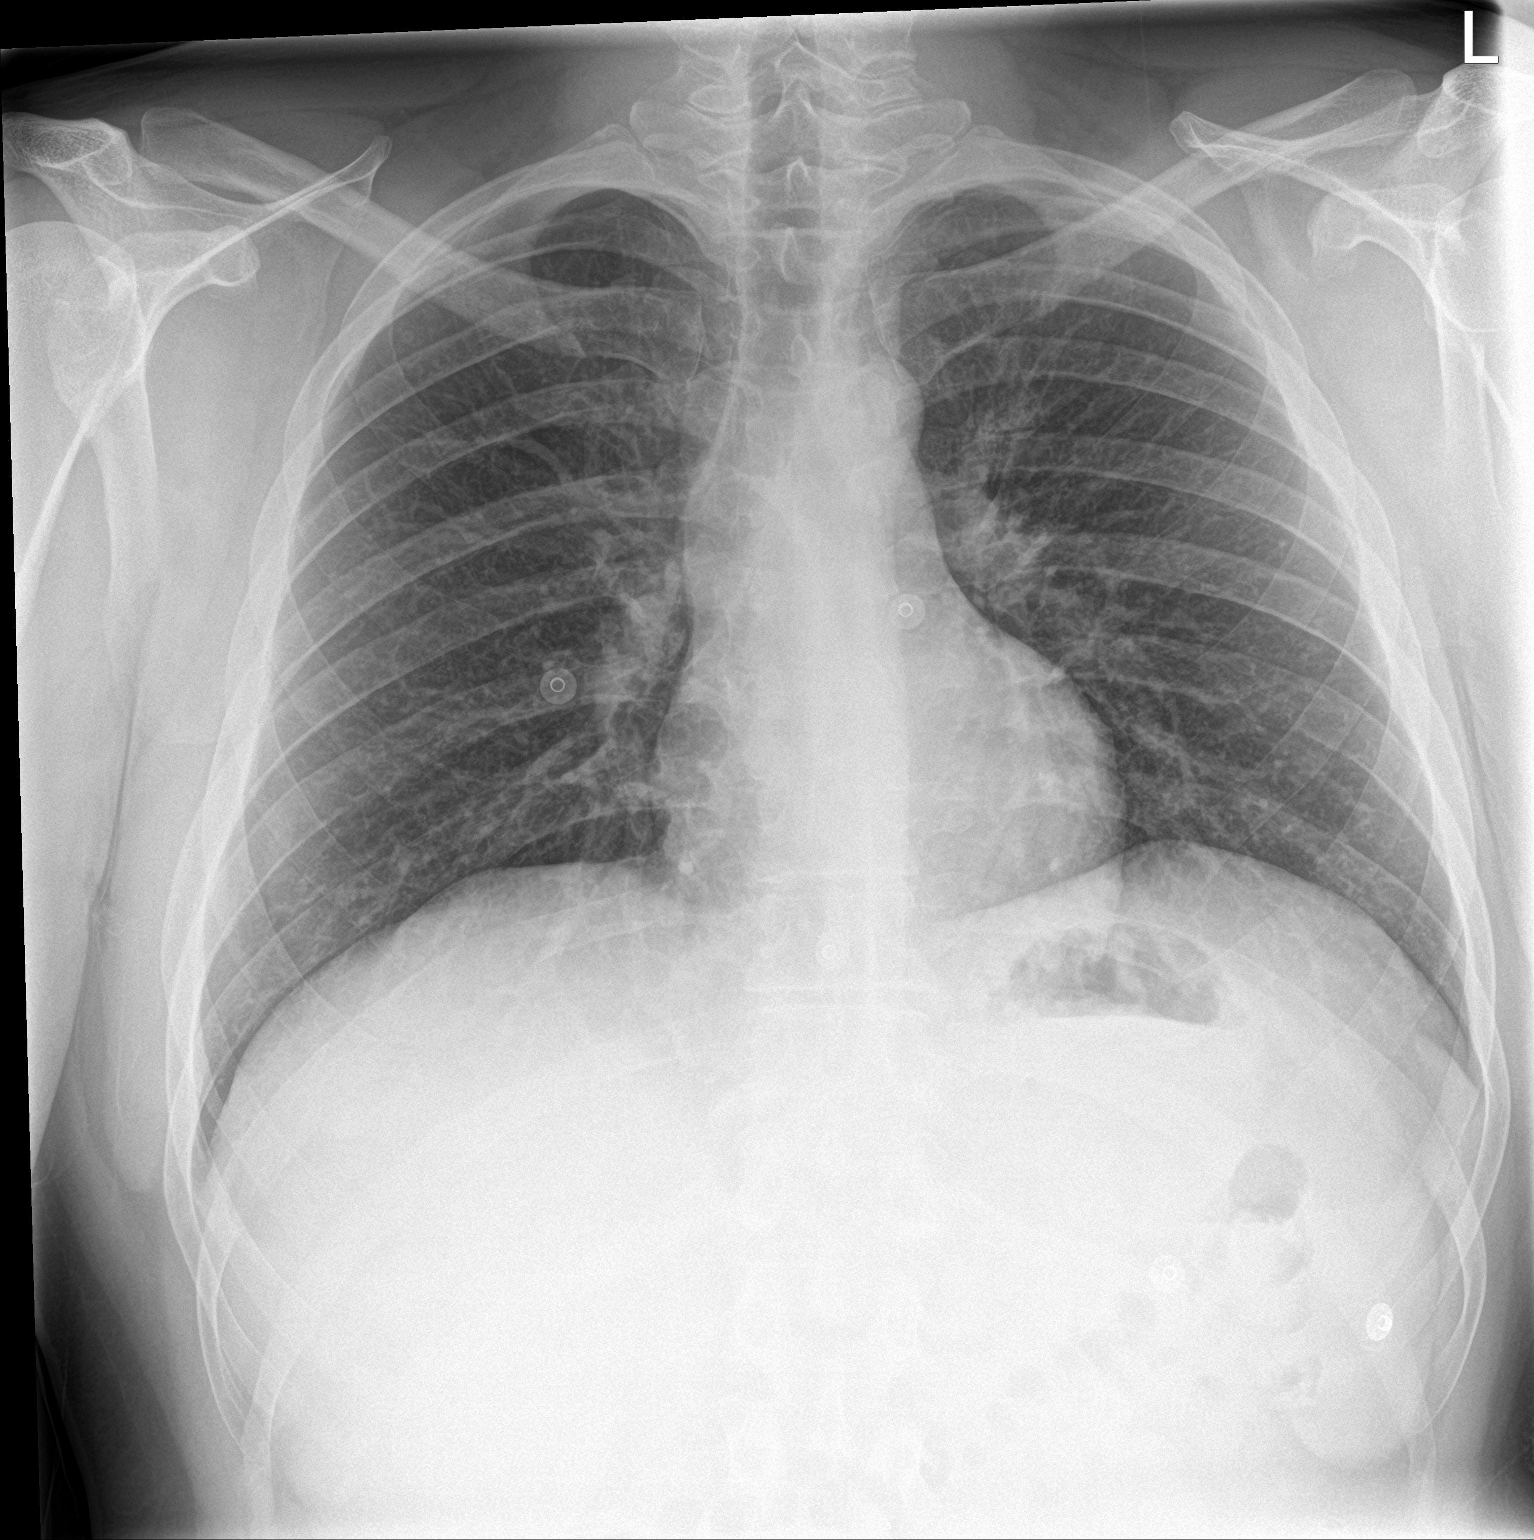

[chest lat]
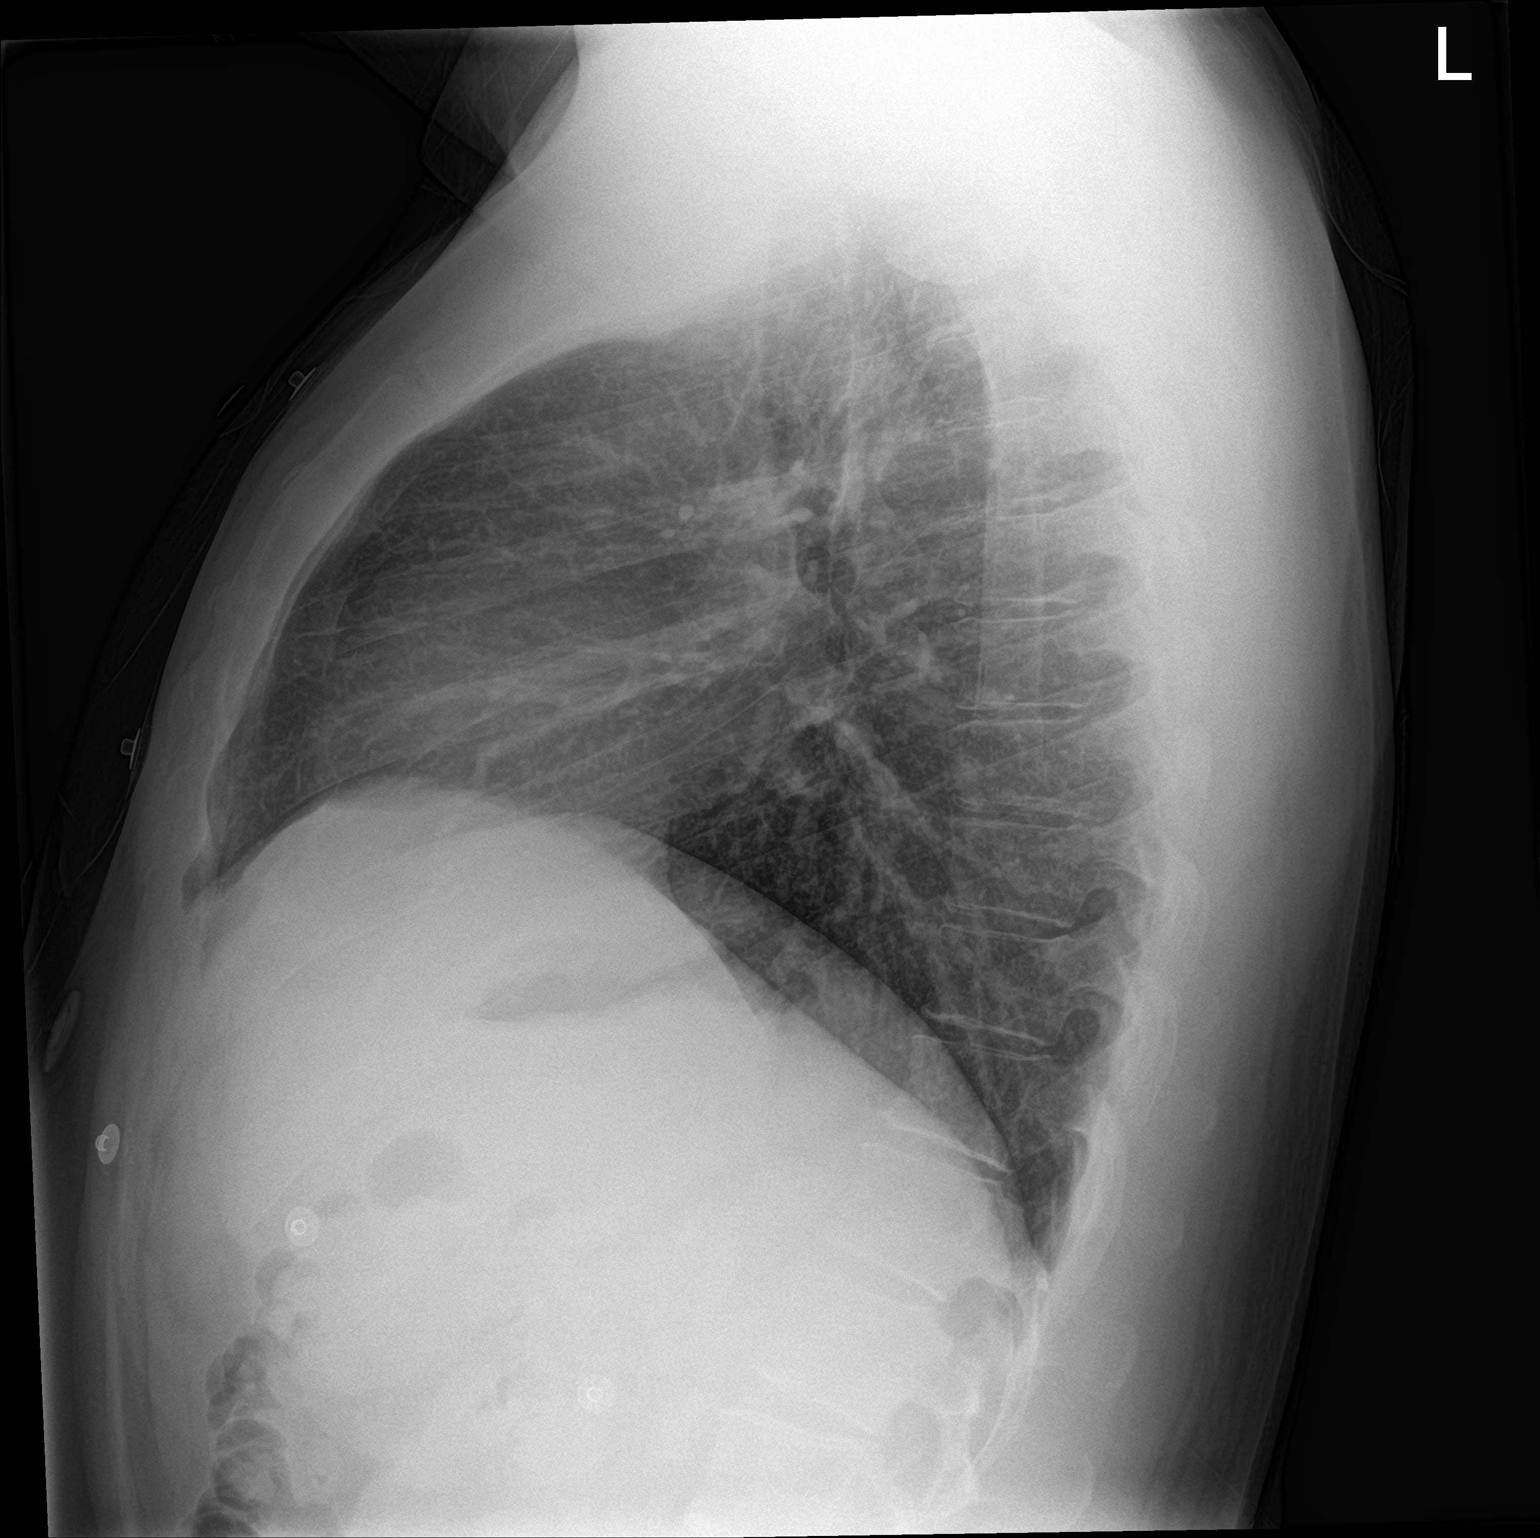

[2 of 2 positions shown; findings below may reference images not displayed]

FINDINGS: No consolidation, features of edema, pneumothorax, or effusion.
Pulmonary vascularity is normally distributed. The cardiomediastinal
contours are unremarkable. No acute osseous or soft tissue
abnormality.
IMPRESSION: No acute cardiopulmonary abnormality.

## 2021-06-19 ENCOUNTER — Encounter: Payer: 59 | Admitting: Family Medicine

## 2021-08-01 ENCOUNTER — Other Ambulatory Visit: Payer: Self-pay

## 2021-08-01 ENCOUNTER — Ambulatory Visit (INDEPENDENT_AMBULATORY_CARE_PROVIDER_SITE_OTHER): Payer: BC Managed Care – PPO | Admitting: Family Medicine

## 2021-08-01 ENCOUNTER — Encounter: Payer: Self-pay | Admitting: Family Medicine

## 2021-08-01 VITALS — BP 112/71 | HR 55 | Temp 97.7°F | Ht 73.75 in | Wt 242.8 lb

## 2021-08-01 DIAGNOSIS — Z79899 Other long term (current) drug therapy: Secondary | ICD-10-CM

## 2021-08-01 DIAGNOSIS — Z0000 Encounter for general adult medical examination without abnormal findings: Secondary | ICD-10-CM

## 2021-08-01 DIAGNOSIS — E781 Pure hyperglyceridemia: Secondary | ICD-10-CM | POA: Diagnosis not present

## 2021-08-01 DIAGNOSIS — F411 Generalized anxiety disorder: Secondary | ICD-10-CM

## 2021-08-01 LAB — COMPREHENSIVE METABOLIC PANEL
ALT: 68 U/L — ABNORMAL HIGH (ref 0–53)
AST: 37 U/L (ref 0–37)
Albumin: 4.7 g/dL (ref 3.5–5.2)
Alkaline Phosphatase: 60 U/L (ref 39–117)
BUN: 15 mg/dL (ref 6–23)
CO2: 29 mEq/L (ref 19–32)
Calcium: 9.7 mg/dL (ref 8.4–10.5)
Chloride: 102 mEq/L (ref 96–112)
Creatinine, Ser: 0.94 mg/dL (ref 0.40–1.50)
GFR: 104.94 mL/min (ref >60.00)
Glucose, Bld: 99 mg/dL (ref 70–99)
Potassium: 4.3 mEq/L (ref 3.5–5.1)
Sodium: 138 mEq/L (ref 135–145)
Total Bilirubin: 0.5 mg/dL (ref 0.2–1.2)
Total Protein: 6.5 g/dL (ref 6.0–8.3)

## 2021-08-01 LAB — CBC WITH DIFFERENTIAL/PLATELET
Basophils Absolute: 0.1 10*3/uL (ref 0.0–0.1)
Basophils Relative: 1.1 % (ref 0.0–3.0)
Eosinophils Absolute: 0.5 10*3/uL (ref 0.0–0.7)
Eosinophils Relative: 9.5 % — ABNORMAL HIGH (ref 0.0–5.0)
HCT: 44.3 % (ref 39.0–52.0)
Hemoglobin: 15.1 g/dL (ref 13.0–17.0)
Lymphocytes Relative: 39 % (ref 12.0–46.0)
Lymphs Abs: 2.1 10*3/uL (ref 0.7–4.0)
MCHC: 34.2 g/dL (ref 30.0–36.0)
MCV: 85.7 fl (ref 78.0–100.0)
Monocytes Absolute: 0.4 10*3/uL (ref 0.1–1.0)
Monocytes Relative: 6.9 % (ref 3.0–12.0)
Neutro Abs: 2.4 10*3/uL (ref 1.4–7.7)
Neutrophils Relative %: 43.5 % (ref 43.0–77.0)
Platelets: 188 10*3/uL (ref 150.0–400.0)
RBC: 5.17 Mil/uL (ref 4.22–5.81)
RDW: 13.1 % (ref 11.5–15.5)
WBC: 5.4 10*3/uL (ref 4.0–10.5)

## 2021-08-01 LAB — TSH: TSH: 1.68 u[IU]/mL (ref 0.35–5.50)

## 2021-08-01 LAB — LIPID PANEL
Cholesterol: 202 mg/dL — ABNORMAL HIGH (ref 0–200)
HDL: 43.1 mg/dL (ref >39.00)
LDL Cholesterol: 120 mg/dL — ABNORMAL HIGH (ref 0–99)
NonHDL: 159.08
Total CHOL/HDL Ratio: 5
Triglycerides: 196 mg/dL — ABNORMAL HIGH (ref 0.0–149.0)
VLDL: 39.2 mg/dL (ref 0.0–40.0)

## 2021-08-01 MED ORDER — OMEPRAZOLE 20 MG PO CPDR: 20.0000 mg | DELAYED_RELEASE_CAPSULE | Freq: Every day | ORAL | 3 refills | Status: DC

## 2021-08-01 NOTE — Progress Notes (Signed)
Office Note ?08/01/2021 ? ?CC:  ?Chief Complaint  ?Patient presents with  ? Annual Exam  ?  Pt is fasting  ? ?Patient is a 36 y.o. male who is here for annual health maintenance exam and follow-up anxiety, GERD, and male pattern baldness. ?A/P as of last visit about 7 months ago: ?"1) GAD, doing well s/p a period of grieving recently d/t his GF passing away. ?He's doing well now.  Plan is to stay on fluoxetine 40mg  qd indefinitely. ?Cont clonaz 0.5mg , 1-2 bid prn.  No new rx was needed for this med today. ?[facility] updated today. ?  ?2) GERD: stable on daily omeprazole 40mg . ?  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 ?INTERIM HX: ?Gary Griffith is doing pretty well other than "feeling old". ?Has a toddler at home, is working his own business long and hard. ?Wakes up feeling tired but once he gets going around 9:00 he has no problem with excessive daytime sleepiness. ?He does not exercise, says it is hard to motivate himself. ? ?He felt good from an emotional/mental/anxiety standpoint so he weaned himself off his Prozac last month and is doing fine. ?He has not taken clonazepam in quite some time and asks that this be removed from his med list.  ?He tried weaning himself off the omeprazole 40 mg and found that this caused significant return of reflux. ?PMP AWARE reviewed today: most recent rx for clonazepam was filled 04/13/21, # 60, rx by me. ?No red flags. ? ? ?Past Medical History:  ?Diagnosis Date  ? ADD (attention deficit disorder) 10/22/2010  ? Adjustment disorder with mixed anxiety and depressed mood 04/2019  ? Dr. 04/15/21 concerned pt worse 05/2018->ween off prozac and start prn clonaz->small rx with close f/u).  ? Asthma   ? Environmental allergies   ? environmental--trees, grass, dust: also various metals make his hands break out (claritin 10mg  qd helps this)  ? Fatty liver 2012  ? noted on abd u/s.  AST and ALT mildly elevated 01/2013 and 12/2017.  Viral Hep NEG 12/2017.  ?  GERD (gastroesophageal reflux disease)   ? Male pattern baldness   ? finasteride 1mg  qd  ? Obesity (BMI 30.0-34.9)   ? ? ?Past Surgical History:  ?Procedure Laterality Date  ? no surgery    ? VASECTOMY    ? ? ?Family History  ?Problem Relation Age of Onset  ? Hypertension Father   ? Diabetes Maternal Grandmother   ? Heart disease Maternal Grandmother   ?     MI, stents  ? Diabetes Paternal Grandfather   ? Cancer Neg Hx   ? ? ?Social History  ? ?Socioeconomic History  ? Marital status: Married  ?  Spouse name: Not on file  ? Number of children: 0  ? Years of education: Not on file  ? Highest education level: Not on file  ?Occupational History  ? Not on file  ?Tobacco Use  ? Smoking status: Never  ? Smokeless tobacco: Never  ?Substance and Sexual Activity  ? Alcohol use: Yes  ?  Alcohol/week: 1.0 standard drink  ?  Types: 1 Cans of beer per week  ?  Comment: w-ends  ? Drug use: No  ? Sexual activity: Yes  ?  Partners: Female  ?Other Topics Concern  ? Not on file  ?Social History Narrative  ?   ? Has 1 daughter, 02/2013 starting kindergarten as of Fall 2018.  ? Married.  Lives in [place].  ?  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  ? 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  ? No T/A/Ds.  ? ?Social Determinants of Health  ? ?Financial Resource Strain: Not on file  ?Food Insecurity: Not on file  ?Transportation Needs: Not on file  ?Physical Activity: Not on file  ?Stress: Not on file  ?Social Connections: Not on file  ?Intimate Partner Violence: Not on file  ? ? ?Outpatient Medications Prior to Visit  ?Medication Sig Dispense Refill  ? finasteride (PROPECIA) 1 MG tablet Take 1 tablet (1 mg total) by mouth daily. 90 tablet 3  ? loratadine (CLARITIN) 10 MG tablet Take 10 mg by mouth daily.    ? Multiple Vitamins-Minerals (MULTIVITAMIN GUMMIES MENS PO) Take by mouth.    ? omeprazole (PRILOSEC) 40 MG capsule Take 1 capsule (40 mg total) by mouth every morning. 90 capsule 3  ?  hydrocortisone-pramoxine (PROCTOFOAM-HC) rectal foam Place 1 applicator rectally 3 (three) times daily. (Patient not taking: Reported on 08/01/2021) 10 g 1  ? sildenafil (REVATIO) 20 MG tablet TAKE 2 TABLETS BY MOUTH DAILY AS NEEDED 30 MINUTES PRIOR TO INTERCOURSE (Patient not taking: Reported on 08/01/2021) 30 tablet 2  ? aluminum chloride (DRYSOL) 20 % external solution Apply qhs, and gradually decrease to 1-2 applications per week as sweating decreases (Patient not taking: Reported on 08/01/2021) 35 mL 6  ? clonazePAM (KLONOPIN) 0.5 MG tablet TAKE 1 TO 2 TABLETS BY MOUTH TWO TIMES A DAY AS NEEDED FOR ANXIETY (Patient not taking: Reported on 08/01/2021) 60 tablet 1  ? FLUoxetine (PROZAC) 40 MG capsule Take 1 capsule (40 mg total) by mouth daily. (Patient not taking: Reported on 08/01/2021) 90 capsule 3  ? ?No facility-administered medications prior to visit.  ? ? ?No Known Allergies ? ?ROS ?Review of Systems  ?Constitutional:  Positive for fatigue. Negative for appetite change, chills and fever.  ?HENT:  Negative for congestion, dental problem, ear pain and sore throat.   ?Eyes:  Negative for discharge, redness and visual disturbance.  ?Respiratory:  Negative for cough, chest tightness, shortness of breath and wheezing.   ?Cardiovascular:  Negative for chest pain, palpitations and leg swelling.  ?Gastrointestinal:  Negative for abdominal pain, blood in stool, diarrhea, nausea and vomiting.  ?Genitourinary:  Negative for difficulty urinating, dysuria, flank pain, frequency, hematuria and urgency.  ?Musculoskeletal:  Negative for arthralgias, back pain, joint swelling, myalgias and neck stiffness.  ?Skin:  Negative for pallor and rash.  ?Neurological:  Negative for dizziness, speech difficulty, weakness and headaches.  ?Hematological:  Negative for adenopathy. Does not bruise/bleed easily.  ?Psychiatric/Behavioral:  Negative for confusion and sleep disturbance. The patient is not nervous/anxious.   ? ?PE; ?Vitals with  BMI 08/01/2021 02/24/2021 02/24/2021  ?Height 6' 1.75" - 6\' 1"   ?Weight 242 lbs 13 oz - 235 lbs  ?BMI 31.39 - 31.01  ?Systolic 112 130 -  ?Diastolic 71 80 -  ?Pulse 55 62 -  ? ?Gen: Alert, well appearing.  Patient is oriented to person, place, time, and situation. ?AFFECT: pleasant, lucid thought and speech. ?ENT: Ears: EACs clear, normal epithelium.  TMs with good light reflex and landmarks bilaterally.  Eyes: no injection, icteris, swelling, or exudate.  EOMI, PERRLA. ?Nose: no drainage or turbinate edema/swelling.  No injection or focal lesion.  Mouth: lips without lesion/swelling.  Oral mucosa pink and moist.  Dentition intact and without obvious caries or gingival swelling.  Oropharynx without erythema, exudate, or swelling.  ?Neck: supple/nontender.  No LAD, mass, or TM.  Carotid pulses 2+ bilaterally, without bruits. ?CV: RRR, no m/r/g.   ?LUNGS: CTA bilat, nonlabored resps, good aeration in all lung fields. ?ABD: soft, NT, ND, BS normal.  No hepatospenomegaly or mass.  No bruits. ?EXT: no clubbing, cyanosis, or edema.  ?Musculoskeletal: no joint swelling, erythema, warmth, or tenderness.  ROM of all joints intact. ?Skin - no sores or suspicious lesions or rashes or color changes ? ? ?Pertinent labs:  ?Lab Results  ?Component Value Date  ? TSH 1.30 10/05/2019  ? ?Lab Results  ?Component Value Date  ? WBC 10.7 (H) 12/18/2019  ? HGB 15.5 12/18/2019  ? HCT 44.3 12/18/2019  ? MCV 85.9 12/18/2019  ? PLT 222 12/18/2019  ? ?Lab Results  ?Component Value Date  ? CREATININE 1.27 (H) 12/18/2019  ? BUN 17 12/18/2019  ? NA 136 12/18/2019  ? K 3.9 12/18/2019  ? CL 100 12/18/2019  ? CO2 23 12/18/2019  ? ?Lab Results  ?Component Value Date  ? ALT 75 (H) 10/05/2019  ? AST 35 10/05/2019  ? ALKPHOS 63 10/05/2019  ? BILITOT 0.6 10/05/2019  ? ?Lab Results  ?Component Value Date  ? CHOL 192 10/05/2019  ? ?Lab Results  ?Component Value Date  ? HDL 43.60 10/05/2019  ? ?Lab Results  ?Component Value Date  ? LDLCALC 112 (H) 10/05/2019   ? ?Lab Results  ?Component Value Date  ? TRIG 185.0 (H) 10/05/2019  ? ?Lab Results  ?Component Value Date  ? CHOLHDL 4 10/05/2019  ? ?Lab Results  ?Component Value Date  ? HGBA1C 5.2 12/24/2017  ? ?ASSESSMENT AND PLAN

## 2021-08-01 NOTE — Patient Instructions (Signed)

## 2021-08-30 DIAGNOSIS — J019 Acute sinusitis, unspecified: Secondary | ICD-10-CM | POA: Diagnosis not present

## 2021-09-24 DIAGNOSIS — Z125 Encounter for screening for malignant neoplasm of prostate: Secondary | ICD-10-CM | POA: Diagnosis not present

## 2021-09-24 DIAGNOSIS — E538 Deficiency of other specified B group vitamins: Secondary | ICD-10-CM | POA: Diagnosis not present

## 2021-09-24 DIAGNOSIS — E559 Vitamin D deficiency, unspecified: Secondary | ICD-10-CM | POA: Diagnosis not present

## 2021-09-24 DIAGNOSIS — Z1321 Encounter for screening for nutritional disorder: Secondary | ICD-10-CM | POA: Diagnosis not present

## 2021-09-24 DIAGNOSIS — F419 Anxiety disorder, unspecified: Secondary | ICD-10-CM | POA: Diagnosis not present

## 2021-09-24 DIAGNOSIS — L659 Nonscarring hair loss, unspecified: Secondary | ICD-10-CM | POA: Diagnosis not present

## 2021-09-24 DIAGNOSIS — R5383 Other fatigue: Secondary | ICD-10-CM | POA: Diagnosis not present

## 2021-09-24 DIAGNOSIS — R790 Abnormal level of blood mineral: Secondary | ICD-10-CM | POA: Diagnosis not present

## 2021-09-24 DIAGNOSIS — R6882 Decreased libido: Secondary | ICD-10-CM | POA: Diagnosis not present

## 2021-10-25 DIAGNOSIS — R5383 Other fatigue: Secondary | ICD-10-CM | POA: Diagnosis not present

## 2021-10-25 DIAGNOSIS — R6882 Decreased libido: Secondary | ICD-10-CM | POA: Diagnosis not present

## 2021-10-25 DIAGNOSIS — E291 Testicular hypofunction: Secondary | ICD-10-CM | POA: Diagnosis not present

## 2021-10-25 DIAGNOSIS — K219 Gastro-esophageal reflux disease without esophagitis: Secondary | ICD-10-CM | POA: Diagnosis not present

## 2021-11-21 ENCOUNTER — Other Ambulatory Visit: Payer: Self-pay | Admitting: Family Medicine

## 2021-12-13 DIAGNOSIS — Z9852 Vasectomy status: Secondary | ICD-10-CM | POA: Diagnosis not present

## 2022-01-15 DIAGNOSIS — H11152 Pinguecula, left eye: Secondary | ICD-10-CM | POA: Diagnosis not present

## 2022-01-15 DIAGNOSIS — H10812 Pingueculitis, left eye: Secondary | ICD-10-CM | POA: Diagnosis not present

## 2022-01-18 ENCOUNTER — Other Ambulatory Visit: Payer: Self-pay | Admitting: Family Medicine

## 2022-01-18 DIAGNOSIS — E291 Testicular hypofunction: Secondary | ICD-10-CM | POA: Diagnosis not present

## 2022-01-18 DIAGNOSIS — E559 Vitamin D deficiency, unspecified: Secondary | ICD-10-CM | POA: Diagnosis not present

## 2022-01-18 DIAGNOSIS — R748 Abnormal levels of other serum enzymes: Secondary | ICD-10-CM | POA: Diagnosis not present

## 2022-01-18 DIAGNOSIS — R790 Abnormal level of blood mineral: Secondary | ICD-10-CM | POA: Diagnosis not present

## 2022-01-18 DIAGNOSIS — K219 Gastro-esophageal reflux disease without esophagitis: Secondary | ICD-10-CM | POA: Diagnosis not present

## 2022-01-18 DIAGNOSIS — R6882 Decreased libido: Secondary | ICD-10-CM | POA: Diagnosis not present

## 2022-01-18 DIAGNOSIS — R5383 Other fatigue: Secondary | ICD-10-CM | POA: Diagnosis not present

## 2022-01-18 DIAGNOSIS — E538 Deficiency of other specified B group vitamins: Secondary | ICD-10-CM | POA: Diagnosis not present

## 2022-01-30 DIAGNOSIS — E291 Testicular hypofunction: Secondary | ICD-10-CM | POA: Diagnosis not present

## 2022-01-30 DIAGNOSIS — K219 Gastro-esophageal reflux disease without esophagitis: Secondary | ICD-10-CM | POA: Diagnosis not present

## 2022-01-30 DIAGNOSIS — R5383 Other fatigue: Secondary | ICD-10-CM | POA: Diagnosis not present

## 2022-01-30 DIAGNOSIS — R748 Abnormal levels of other serum enzymes: Secondary | ICD-10-CM | POA: Diagnosis not present

## 2022-02-19 ENCOUNTER — Encounter: Payer: Self-pay | Admitting: Family Medicine

## 2022-02-19 ENCOUNTER — Ambulatory Visit: Payer: BC Managed Care – PPO | Admitting: Family Medicine

## 2022-02-19 VITALS — BP 123/71 | HR 56 | Temp 97.7°F | Ht 73.75 in | Wt 247.6 lb

## 2022-02-19 DIAGNOSIS — G4733 Obstructive sleep apnea (adult) (pediatric): Secondary | ICD-10-CM

## 2022-02-19 DIAGNOSIS — G4719 Other hypersomnia: Secondary | ICD-10-CM | POA: Diagnosis not present

## 2022-02-19 DIAGNOSIS — R0602 Shortness of breath: Secondary | ICD-10-CM

## 2022-02-19 NOTE — Progress Notes (Signed)
OFFICE VISIT  02/19/2022  CC:  Chief Complaint  Patient presents with   Breathing issues    Pt feels like he is unable to get a deep breathe in for the past 2 weeks. Unsure if it is stress-related. Yawning all day and unable to get a deep breathe. Wife states it is after stress-related events. Hx of childhood asthma.     Patient is a 36 y.o. male who presents for breathing concerns.  HPI: He describes some difficulty and feeling like he can take a deep breath over the last month or so. No coughing or wheezing or chest pain. 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  No lower extremity swelling. He never wakes up feeling rested.  Says he remains very sleepy all through his day and yawns all day. Says he could fall asleep in less than a minute. He pushes through all this and works very hard.  He admits he drinks too much alcohol and does not take care of himself very well.  No formal exercise but he is very active throughout his day. In the last few weeks he has started to wean himself off of his Prozac because he felt no problems with excessive depression or anxiety anymore. His wife adds that he tends to complain more of the inability to get a deep breath when they have just been through some sort of stressful event such as an argument or disagreement or something stressful with work. He adds that he even feels the inability to take deep breaths when he is driving in his car alone. Wife says he does snore when he initiate sleep but then midway through the night is not snoring.  She has not witnessed any apneic spells. Denies any restless leg syndrome symptoms.  Of note, he sees an integrative medicine provider who is optimizing his testosterone level.  Patient reports his testosterone was in the 200s prior to getting on testosterone and then most recent check was greater than 1500.  This dose was then decreased.   Past  Medical History:  Diagnosis Date   ADD (attention deficit disorder) 10/22/2010   Adjustment disorder with mixed anxiety and depressed mood 04/2019   Dr. Mingo Amber concerned pt worse 05/2018->ween off prozac and start prn clonaz->small rx with close f/u).   Asthma    Environmental allergies    environmental--trees, grass, dust: also various metals make his hands break out (claritin 10mg  qd helps this)   Fatty liver 2012   noted on abd u/s.  AST and ALT mildly elevated 01/2013 and 12/2017.  Viral Hep NEG 12/2017.   GERD (gastroesophageal reflux disease)    Male pattern baldness    finasteride 1mg  qd   Obesity (BMI 30.0-34.9)     Past Surgical History:  Procedure Laterality Date   no surgery     VASECTOMY      Outpatient Medications Prior to Visit  Medication Sig Dispense Refill   Cyanocobalamin (B-12 PO) Take by mouth daily.     finasteride (PROPECIA) 1 MG tablet TAKE ONE TABLET BY MOUTH DAILY 30 tablet 0   loratadine (CLARITIN) 10 MG tablet Take 10 mg by mouth daily.     MAGNESIUM PO Take by mouth daily.     Multiple Vitamins-Minerals (MULTIVITAMIN GUMMIES MENS PO) Take by mouth.     omeprazole (PRILOSEC) 20 MG capsule Take 1 capsule (20 mg total) by mouth daily. 90 capsule 3   OVER THE  COUNTER MEDICATION Take 3 tablets by mouth daily. Liver Refresh     TESTOSTERONE CYPIONATE IJ Inject as directed.     VITAMIN D PO Take by mouth daily.     sildenafil (REVATIO) 20 MG tablet TAKE 2 TABLETS BY MOUTH DAILY AS NEEDED 30 MINUTES PRIOR TO INTERCOURSE (Patient not taking: Reported on 02/19/2022) 30 tablet 2   hydrocortisone-pramoxine (PROCTOFOAM-HC) rectal foam Place 1 applicator rectally 3 (three) times daily. (Patient not taking: Reported on 08/01/2021) 10 g 1   omeprazole (PRILOSEC) 40 MG capsule TAKE ONE CAPSULE BY MOUTH EVERY MORNING (Patient not taking: Reported on 02/19/2022) 90 capsule 3   No facility-administered medications prior to visit.    No Known Allergies  ROS As per  HPI  PE:    02/19/2022    8:39 AM 08/01/2021    8:21 AM 02/24/2021    7:58 PM  Vitals with BMI  Height 6' 1.75" 6' 1.75"   Weight 247 lbs 10 oz 242 lbs 13 oz   BMI 45.80 99.83   Systolic 382 505 397  Diastolic 71 71 80  Pulse 56 55 62     Physical Exam  Neuro: Alert and well-appearing.  He does appear sleepy-tired. Affect is pleasant, thought and speech are lucid. QBH:ALPF: no injection, icteris, swelling, or exudate.  EOMI, PERRLA. Mouth: lips without lesion/swelling.  Oral mucosa pink and moist. Oropharynx without erythema, exudate, or swelling.  CV: RRR, no m/r/g.   LUNGS: CTA bilat, nonlabored resps, good aeration in all lung fields. He can take deep full breaths when asked on exam today. EXT: no clubbing or cyanosis.  no edema.   LABS:  Last CBC Lab Results  Component Value Date   WBC 5.4 08/01/2021   HGB 15.1 08/01/2021   HCT 44.3 08/01/2021   MCV 85.7 08/01/2021   MCH 30.0 12/18/2019   RDW 13.1 08/01/2021   PLT 188.0 79/06/4095   Last metabolic panel Lab Results  Component Value Date   GLUCOSE 99 08/01/2021   NA 138 08/01/2021   K 4.3 08/01/2021   CL 102 08/01/2021   CO2 29 08/01/2021   BUN 15 08/01/2021   CREATININE 0.94 08/01/2021   GFRNONAA >60 12/18/2019   CALCIUM 9.7 08/01/2021   PROT 6.5 08/01/2021   ALBUMIN 4.7 08/01/2021   BILITOT 0.5 08/01/2021   ALKPHOS 60 08/01/2021   AST 37 08/01/2021   ALT 68 (H) 08/01/2021   ANIONGAP 13 12/18/2019   Last thyroid functions Lab Results  Component Value Date   TSH 1.68 08/01/2021   IMPRESSION AND PLAN:  1 difficulty taking deep breath. Multifactorial: Excessive stress, fatigue.  Attentionally some contribution from recent weaning down his dose of Prozac, some possible contribution from testosterone over supplementation.  Suspect his excessive daytime sleepiness may be due to obstructive sleep apnea.  We discussed the importance of dedicated time for exercise and and for rest. Discussed starting  slow and gradually building on changes.  #2 excessive daytime sleepiness. Need to rule out obstructive sleep apnea. Furred to pulmonology today.  Spent 33 min with pt today reviewing HPI, reviewing relevant past history, doing exam, reviewing and discussing differential diagnosis, discussing behavioral therapeutic options, and formulating plans.  An After Visit Summary was printed and given to the patient.  FOLLOW UP: Return in about 3 months (around 05/22/2022) for f/u breathing, fatigue, stress.  Signed:  Crissie Sickles, MD           02/19/2022

## 2022-02-21 ENCOUNTER — Other Ambulatory Visit: Payer: Self-pay | Admitting: Family Medicine

## 2022-02-22 ENCOUNTER — Other Ambulatory Visit: Payer: Self-pay | Admitting: Family Medicine

## 2022-02-22 MED ORDER — FINASTERIDE 1 MG PO TABS: 1.0000 mg | ORAL_TABLET | Freq: Every day | ORAL | 0 refills | Status: DC

## 2022-02-25 ENCOUNTER — Encounter: Payer: Self-pay | Admitting: Family Medicine

## 2022-02-25 ENCOUNTER — Other Ambulatory Visit: Payer: Self-pay | Admitting: Family Medicine

## 2022-02-25 MED ORDER — FLUOXETINE HCL 40 MG PO CAPS: 40.0000 mg | ORAL_CAPSULE | Freq: Every day | ORAL | 1 refills | Status: DC

## 2022-02-25 NOTE — Telephone Encounter (Signed)
Okay Prozac 40 mg capsules sent to [facility] today.

## 2022-03-05 ENCOUNTER — Encounter: Payer: Self-pay | Admitting: Nurse Practitioner

## 2022-03-05 ENCOUNTER — Ambulatory Visit (INDEPENDENT_AMBULATORY_CARE_PROVIDER_SITE_OTHER): Payer: BC Managed Care – PPO | Admitting: Nurse Practitioner

## 2022-03-05 VITALS — BP 136/78 | HR 64 | Ht 73.0 in | Wt 245.6 lb

## 2022-03-05 DIAGNOSIS — G4719 Other hypersomnia: Secondary | ICD-10-CM | POA: Diagnosis not present

## 2022-03-05 DIAGNOSIS — E669 Obesity, unspecified: Secondary | ICD-10-CM

## 2022-03-05 DIAGNOSIS — R0609 Other forms of dyspnea: Secondary | ICD-10-CM | POA: Diagnosis not present

## 2022-03-05 DIAGNOSIS — R0602 Shortness of breath: Secondary | ICD-10-CM | POA: Diagnosis not present

## 2022-03-05 NOTE — Progress Notes (Unsigned)
'@Patient'  ID: Gary Griffith, male    DOB: 24-Jan-1986, 36 y.o.   MRN: [id number]  Chief Complaint  Patient presents with   Consult    Referring provider: Tammi Sou, MD  HPI: 36 year old male, never smoker referred for sleep consult.   TEST/EVENTS:   03/05/2022: Today - sleep consult Patient presents today for sleep consult referred by his PCP, Dr. Anitra Lauth. He has been struggling with daytime fatigue for a long time now. He wakes up in the morning feeling like he barely slept. He does occasionally snore but doesn't feel like it's very bad. He does have some issues with drowsy driving; never fallen asleep while driving. He denies morning headaches, witnessed apneas, sleep parasomnias/paralysis. No history of narcolepsy or cataplexy. He goes to bed between 9-10 pm. Falls asleep within just a few minutes. Wakes 1-2 times a night. Officially out of the bed around 6 am. No sleep aides or caffeine. Weight stable over the past few years. Lives at home with his wife and two kids, 57 and 21. Never smoker. Drinks 3 beers daily. 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 No significant cardiac history. No history of stroke. Family history of heart disease.  He has also been struggling with his breathing lately. He feels like he gets short winded easily with exertion. He also feels like he can't get a deep breath in, which makes him yawn throughout the day. This has been ongoing for a few months now. He did have asthma as a child. Hasn't been on inhalers as an adult. Doesn't have any history of recurrent bronchitis. Denies any cough, chest congestion, wheezing, orthopnea, PND, chest pain, palpitations, leg swelling.   Epworth 13  No Known Allergies  Immunization History  Administered Date(s) Administered   DTaP 02/14/1986, 04/20/1986, 06/14/1986, 07/18/1987, 10/23/1990   HIB (PRP-OMP) 01/06/1987, 07/18/1987   Influenza,inj,Quad PF,6+ Mos 02/01/2013, 03/03/2014,  03/25/2018   MMR 03/20/1987, 10/23/1990   OPV 02/16/1986, 04/20/1986, 07/18/1987, 10/23/1990   PFIZER(Purple Top)SARS-COV-2 Vaccination 08/03/2019, 08/25/2019   Tdap 10/02/2010, 12/14/2020    Past Medical History:  Diagnosis Date   ADD (attention deficit disorder) 10/22/2010   Adjustment disorder with mixed anxiety and depressed mood 04/2019   Dr. Franne Forts concerned pt worse 05/2018->ween off prozac and start prn clonaz->small rx with close f/u).   Asthma    Environmental allergies    environmental--trees, grass, dust: also various metals make his hands break out (claritin 62m qd helps this)   Fatty liver 2012   noted on abd u/s.  AST and ALT mildly elevated 01/2013 and 12/2017.  Viral Hep NEG 12/2017.   GERD (gastroesophageal reflux disease)    Male pattern baldness    finasteride 159mqd   Obesity (BMI 30.0-34.9)     Tobacco History: Social History   Tobacco Use  Smoking Status Never  Smokeless Tobacco Never   Counseling given: Not Answered   Outpatient Medications Prior to Visit  Medication Sig Dispense Refill   Cyanocobalamin (B-12 PO) Take by mouth daily.     finasteride (PROPECIA) 1 MG tablet Take 1 tablet (1 mg total) by mouth daily. 90 tablet 0   FLUoxetine (PROZAC) 40 MG capsule Take 1 capsule (40 mg total) by mouth daily. 90 capsule 1   loratadine (CLARITIN) 10 MG tablet Take 10 mg by mouth daily.     MAGNESIUM PO Take by mouth daily.     Multiple Vitamins-Minerals (MULTIVITAMIN GUMMIES MENS PO) Take by  mouth.     omeprazole (PRILOSEC) 20 MG capsule Take 1 capsule (20 mg total) by mouth daily. 90 capsule 3   OVER THE COUNTER MEDICATION Take 3 tablets by mouth daily. Liver Refresh     TESTOSTERONE CYPIONATE IJ Inject as directed.     VITAMIN D PO Take by mouth daily.     sildenafil (REVATIO) 20 MG tablet TAKE 2 TABLETS BY MOUTH DAILY AS NEEDED 30 MINUTES PRIOR TO INTERCOURSE (Patient not taking: Reported on 02/19/2022) 30 tablet 2   No facility-administered  medications prior to visit.     Review of Systems:   Constitutional: No weight loss or gain, night sweats, fevers, chills, or lassitude. +excessive daytime fatigue  HEENT: No headaches, difficulty swallowing, tooth/dental problems, or sore throat. No sneezing, itching, ear ache, nasal congestion, or post nasal drip CV:  No chest pain, orthopnea, PND, swelling in lower extremities, anasarca, dizziness, palpitations, syncope Resp: +shortness of breath with exertion; unable to get full deep breath; minimal snoring. No excess mucus or change in color of mucus. No productive or non-productive. No hemoptysis. No wheezing.  No chest wall deformity GI:  No heartburn, indigestion, abdominal pain, nausea, vomiting, diarrhea, change in bowel habits, loss of appetite, bloody stools.  GU: No dysuria, change in color of urine, urgency or frequency.  No flank pain, no hematuria  Skin: No rash, lesions, ulcerations MSK:  No joint pain or swelling.  No decreased range of motion.  No back pain. Neuro: No dizziness or lightheadedness.  Psych: No depression or anxiety. Mood stable. +sleep disturbance    Physical Exam:  BP 136/78 (BP Location: Right Arm, Cuff Size: Normal)   Pulse 64   Ht '6\' 1"'  (1.854 m)   Wt 245 lb 9.6 oz (111.4 kg)   SpO2 96%   BMI 32.40 kg/m   GEN: Pleasant, interactive, well-appearing; obese; in no acute distress. HEENT:  Normocephalic and atraumatic. PERRLA. Sclera white. Nasal turbinates pink, moist and patent bilaterally. No rhinorrhea present. Oropharynx pink and moist, without exudate or edema. No lesions, ulcerations, or postnasal drip.  NECK:  Supple w/ fair ROM. No JVD present. Normal carotid impulses w/o bruits. Thyroid symmetrical with no goiter or nodules palpated. No lymphadenopathy.   CV: RRR, no m/r/g, no peripheral edema. Pulses intact, +2 bilaterally. No cyanosis, pallor or clubbing. PULMONARY:  Unlabored, regular breathing. Clear bilaterally A&P w/o  wheezes/rales/rhonchi. No accessory muscle use.  GI: BS present and normoactive. Soft, non-tender to palpation. No organomegaly or masses detected.  MSK: No erythema, warmth or tenderness. Cap refil <2 sec all extrem. No deformities or joint swelling noted.  Neuro: A/Ox3. No focal deficits noted.   Skin: Warm, no lesions or rashe Psych: Normal affect and behavior. Judgement and thought content appropriate.     Lab Results:  CBC    Component Value Date/Time   WBC 5.4 08/01/2021 0849   RBC 5.17 08/01/2021 0849   HGB 15.1 08/01/2021 0849   HCT 44.3 08/01/2021 0849   PLT 188.0 08/01/2021 0849   MCV 85.7 08/01/2021 0849   MCH 30.0 12/18/2019 2234   MCHC 34.2 08/01/2021 0849   RDW 13.1 08/01/2021 0849   LYMPHSABS 2.1 08/01/2021 0849   MONOABS 0.4 08/01/2021 0849   EOSABS 0.5 08/01/2021 0849   BASOSABS 0.1 08/01/2021 0849    BMET    Component Value Date/Time   NA 138 08/01/2021 0849   K 4.3 08/01/2021 0849   CL 102 08/01/2021 0849   CO2 29 08/01/2021 0849  GLUCOSE 99 08/01/2021 0849   BUN 15 08/01/2021 0849   CREATININE 0.94 08/01/2021 0849   CREATININE 0.77 02/03/2013 0816   CALCIUM 9.7 08/01/2021 0849   GFRNONAA >60 12/18/2019 2234   GFRNONAA >89 02/03/2013 0816   GFRAA >60 12/18/2019 2234   GFRAA >89 02/03/2013 0816    BNP No results found for: "BNP"   Imaging:  No results found.        No data to display          No results found for: "NITRICOXIDE"      Assessment & Plan:   Excessive daytime sleepiness He has snoring, excessive daytime sleepiness, poor sleep quality. BMI 32. Epworth 13. Given this,  I am concerned he could have sleep disordered breathing with obstructive sleep apnea. He will need sleep study for further evaluation.  Reviewed correlation between alcohol use and disruptive sleep. Encouraged him to work on decreasing consumption.    - discussed how weight can impact sleep and risk for sleep disordered breathing - discussed  options to assist with weight loss: combination of diet modification, cardiovascular and strength training exercises   - had an extensive discussion regarding the adverse health consequences related to untreated sleep disordered breathing - specifically discussed the risks for hypertension, coronary artery disease, cardiac dysrhythmias, cerebrovascular disease, and diabetes - lifestyle modification discussed   - discussed how sleep disruption can increase risk of accidents, particularly when driving - safe driving practices were discussed  Patient Instructions  Given your symptoms, I am concerned that you may have sleep disordered breathing with sleep apnea. I have ordered a sleep study for further evaluation. Someone will contact you to schedule this.   We discussed how untreated sleep apnea puts an individual at risk for cardiac arrhthymias, pulm HTN, DM, stroke and increases their risk for daytime accidents. We also briefly reviewed treatment options including weight loss, side sleeping position, oral appliance, CPAP therapy or referral to ENT for possible surgical options  Albuterol inhaler 2 puffs every 6 hours as needed for shortness of breath or wheezing. Notify if symptoms persist despite rescue inhaler/neb use.   Pulmonary function testing ordered today  Follow up after PFTS and HST with Katie Shaila Gilchrest,NP or sooner if needed    DOE (dyspnea on exertion) Increased dyspnea upon exertion and trouble with deep breathing over the past few months. History of asthma; never on maintenance inhalers. No known triggers that he can identify. Recommended trial of albuterol to see if he has any perceived benefit. We will obtain pulmonary function testing for further evaluation.   Obesity (BMI 30.0-34.9) BMI 32. Healthy weight loss and exercise encouraged.    I spent 45 minutes of dedicated to the care of this patient on the date of this encounter to include pre-visit review of records,  face-to-face time with the patient discussing conditions above, post visit ordering of testing, clinical documentation with the electronic health record, making appropriate referrals as documented, and communicating necessary findings to members of the patients care team.  Clayton Bibles, NP 03/06/2022  Pt aware and understands NP's role.

## 2022-03-05 NOTE — Assessment & Plan Note (Signed)
Increased dyspnea upon exertion and trouble with deep breathing over the past few months. History of asthma; never on maintenance inhalers. No known triggers that he can identify. Recommended trial of albuterol to see if he has any perceived benefit. We will obtain pulmonary function testing for further evaluation.

## 2022-03-05 NOTE — Patient Instructions (Addendum)
Given your symptoms, I am concerned that you may have sleep disordered breathing with sleep apnea. I have ordered a sleep study for further evaluation. Someone will contact you to schedule this.   We discussed how untreated sleep apnea puts an individual at risk for cardiac arrhthymias, pulm HTN, DM, stroke and increases their risk for daytime accidents. We also briefly reviewed treatment options including weight loss, side sleeping position, oral appliance, CPAP therapy or referral to ENT for possible surgical options  Albuterol inhaler 2 puffs every 6 hours as needed for shortness of breath or wheezing. Notify if symptoms persist despite rescue inhaler/neb use.   Pulmonary function testing ordered today  Follow up after PFTS and HST with Katie Tassie Pollett,NP or sooner if needed

## 2022-03-05 NOTE — Assessment & Plan Note (Signed)
He has snoring, excessive daytime sleepiness, poor sleep quality. BMI 32. Epworth 13. Given this,  I am concerned he could have sleep disordered breathing with obstructive sleep apnea. He will need sleep study for further evaluation.  Reviewed correlation between alcohol use and disruptive sleep. Encouraged him to work on decreasing consumption.    - discussed how weight can impact sleep and risk for sleep disordered breathing - discussed options to assist with weight loss: combination of diet modification, cardiovascular and strength training exercises   - had an extensive discussion regarding the adverse health consequences related to untreated sleep disordered breathing - specifically discussed the risks for hypertension, coronary artery disease, cardiac dysrhythmias, cerebrovascular disease, and diabetes - lifestyle modification discussed   - discussed how sleep disruption can increase risk of accidents, particularly when driving - safe driving practices were discussed  Patient Instructions  Given your symptoms, I am concerned that you may have sleep disordered breathing with sleep apnea. I have ordered a sleep study for further evaluation. Someone will contact you to schedule this.   We discussed how untreated sleep apnea puts an individual at risk for cardiac arrhthymias, pulm HTN, DM, stroke and increases their risk for daytime accidents. We also briefly reviewed treatment options including weight loss, side sleeping position, oral appliance, CPAP therapy or referral to ENT for possible surgical options  Albuterol inhaler 2 puffs every 6 hours as needed for shortness of breath or wheezing. Notify if symptoms persist despite rescue inhaler/neb use.   Pulmonary function testing ordered today  Follow up after PFTS and HST with Katie Shera Laubach,NP or sooner if needed

## 2022-03-06 ENCOUNTER — Encounter: Payer: Self-pay | Admitting: Nurse Practitioner

## 2022-03-06 ENCOUNTER — Telehealth: Payer: Self-pay | Admitting: Nurse Practitioner

## 2022-03-06 DIAGNOSIS — E669 Obesity, unspecified: Secondary | ICD-10-CM | POA: Insufficient documentation

## 2022-03-06 MED ORDER — ALBUTEROL SULFATE HFA 108 (90 BASE) MCG/ACT IN AERS: 2.0000 | INHALATION_SPRAY | Freq: Four times a day (QID) | RESPIRATORY_TRACT | 6 refills | Status: DC | PRN

## 2022-03-06 NOTE — Assessment & Plan Note (Signed)
BMI 32. Healthy weight loss and exercise encouraged.

## 2022-03-06 NOTE — Telephone Encounter (Signed)
Rx sent to preferred pharmacy for pt. Called and spoke with pt letting him know this had been done and he verbalized understanding. Nothing further needed.

## 2022-03-06 NOTE — Telephone Encounter (Signed)
Patient called to request that his inhaler be sent to the [facility] in [place].  He stated that the doctor said she would send it in at his last visit yesterday.  The pharmacy said they did not have the order.  Please advise.

## 2022-03-06 NOTE — Progress Notes (Signed)
Reviewed and agree with assessment/plan.   Velicia Dejager, MD [facility] 03/06/2022, 9:17 AM Pager:  [phone]  

## 2022-03-18 ENCOUNTER — Encounter: Payer: Self-pay | Admitting: Family Medicine

## 2022-03-18 NOTE — Telephone Encounter (Signed)
See other encounter.

## 2022-03-18 NOTE — Telephone Encounter (Signed)
Patient is requesting refill on hemorrhoid medication.  Ran out of refills, prescription expired.  [facility]

## 2022-03-19 ENCOUNTER — Other Ambulatory Visit: Payer: Self-pay

## 2022-03-19 MED ORDER — HYDROCORT-PRAMOXINE (PERIANAL) 1-1 % EX FOAM: 1.0000 | Freq: Three times a day (TID) | CUTANEOUS | 1 refills | Status: DC

## 2022-03-25 ENCOUNTER — Encounter: Payer: Self-pay | Admitting: Family Medicine

## 2022-04-04 ENCOUNTER — Ambulatory Visit (INDEPENDENT_AMBULATORY_CARE_PROVIDER_SITE_OTHER): Payer: BC Managed Care – PPO | Admitting: Pulmonary Disease

## 2022-04-04 DIAGNOSIS — R0602 Shortness of breath: Secondary | ICD-10-CM

## 2022-04-04 LAB — PULMONARY FUNCTION TEST
DL/VA % pred: 101 %
DL/VA: 4.78 ml/min/mmHg/L
DLCO cor % pred: 92 %
DLCO cor: 31.97 ml/min/mmHg
DLCO unc % pred: 92 %
DLCO unc: 31.97 ml/min/mmHg
FEF 25-75 Post: 7.2 L/sec
FEF 25-75 Pre: 6.4 L/sec
FEF2575-%Change-Post: 12 %
FEF2575-%Pred-Post: 161 %
FEF2575-%Pred-Pre: 143 %
FEV1-%Change-Post: 2 %
FEV1-%Pred-Post: 106 %
FEV1-%Pred-Pre: 103 %
FEV1-Post: 5.03 L
FEV1-Pre: 4.89 L
FEV1FVC-%Change-Post: 2 %
FEV1FVC-%Pred-Pre: 109 %
FEV6-%Change-Post: 0 %
FEV6-%Pred-Post: 96 %
FEV6-%Pred-Pre: 96 %
FEV6-Post: 5.59 L
FEV6-Pre: 5.56 L
FEV6FVC-%Pred-Post: 102 %
FEV6FVC-%Pred-Pre: 102 %
FVC-%Change-Post: 0 %
FVC-%Pred-Post: 94 %
FVC-%Pred-Pre: 94 %
FVC-Post: 5.59 L
FVC-Pre: 5.56 L
Post FEV1/FVC ratio: 90 %
Post FEV6/FVC ratio: 100 %
Pre FEV1/FVC ratio: 88 %
Pre FEV6/FVC Ratio: 100 %
RV % pred: 103 %
RV: 1.99 L
TLC % pred: 100 %
TLC: 7.53 L

## 2022-04-04 NOTE — Progress Notes (Signed)
PFT done today. 

## 2022-04-18 NOTE — Progress Notes (Signed)
Pulmonary function testing was normal. We will follow up with him after his sleep study to discuss results.

## 2022-05-21 ENCOUNTER — Other Ambulatory Visit: Payer: Self-pay | Admitting: Family Medicine

## 2022-05-28 ENCOUNTER — Ambulatory Visit: Payer: BC Managed Care – PPO | Admitting: Family Medicine

## 2022-05-29 ENCOUNTER — Ambulatory Visit: Payer: BC Managed Care – PPO | Admitting: Family Medicine

## 2022-05-31 ENCOUNTER — Other Ambulatory Visit: Payer: Self-pay | Admitting: Family Medicine

## 2022-06-26 ENCOUNTER — Other Ambulatory Visit: Payer: Self-pay | Admitting: Family Medicine

## 2022-07-05 ENCOUNTER — Other Ambulatory Visit: Payer: Self-pay | Admitting: Family Medicine

## 2022-07-19 ENCOUNTER — Telehealth (INDEPENDENT_AMBULATORY_CARE_PROVIDER_SITE_OTHER): Payer: BC Managed Care – PPO | Admitting: Family Medicine

## 2022-07-19 ENCOUNTER — Encounter: Payer: Self-pay | Admitting: Family Medicine

## 2022-07-19 DIAGNOSIS — J302 Other seasonal allergic rhinitis: Secondary | ICD-10-CM | POA: Diagnosis not present

## 2022-07-19 DIAGNOSIS — L659 Nonscarring hair loss, unspecified: Secondary | ICD-10-CM | POA: Diagnosis not present

## 2022-07-19 DIAGNOSIS — F411 Generalized anxiety disorder: Secondary | ICD-10-CM | POA: Diagnosis not present

## 2022-07-19 MED ORDER — PREDNISONE 20 MG PO TABS: ORAL_TABLET | ORAL | 0 refills | Status: DC

## 2022-07-19 MED ORDER — FINASTERIDE 1 MG PO TABS: 1.0000 mg | ORAL_TABLET | Freq: Every day | ORAL | 3 refills | Status: DC

## 2022-07-19 NOTE — Progress Notes (Signed)
Virtual Visit via Video Note  I connected with Gary Griffith  on 07/19/22 at  1:00 PM EST by a video enabled telemedicine application and verified that I am speaking with the correct person using two identifiers.  Location patient: [place] Location provider:work or home office Persons participating in the virtual visit: patient, provider  I discussed the limitations and requested verbal permission for telemedicine visit. The patient expressed understanding and agreed to proceed.   HPI: 37 year old male being seen today for follow-up follow-up chronic anxiety. Feeling well.  No problems with generalized anxiety, panic episodes, or depressed mood.  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  Needs refill.  Last couple of weeks has had lots of nasal congestion and some mucus production, sneezing, some sinus headaches. Says this happens every spring.  He has been using Afrin a lot over the last week.  Takes Claritin 10 mg a day.  ROS: See pertinent positives and negatives per HPI.  Past Medical History:  Diagnosis Date   ADD (attention deficit disorder) 10/22/2010   Adjustment disorder with mixed anxiety and depressed mood 04/2019   Dr. Franne Forts concerned pt worse 05/2018->ween off prozac and start prn clonaz->small rx with close f/u).   Asthma    Environmental allergies    environmental--trees, grass, dust: also various metals make his hands break out (claritin '10mg'$  qd helps this)   Fatty liver 2012   noted on abd u/s.  AST and ALT mildly elevated 01/2013 and 12/2017.  Viral Hep NEG 12/2017.   GERD (gastroesophageal reflux disease)    Male pattern baldness    finasteride '1mg'$  qd   Obesity (BMI 30.0-34.9)     Past Surgical History:  Procedure Laterality Date   no surgery     VASECTOMY       Current Outpatient Medications:    Cyanocobalamin (B-12 PO), Take by mouth daily., Disp: , Rfl:    finasteride (PROPECIA) 1 MG tablet, TAKE 1  TABLET BY MOUTH EVERY DAY, Disp: 30 tablet, Rfl: 0   FLUoxetine (PROZAC) 40 MG capsule, Take 1 capsule (40 mg total) by mouth daily., Disp: 90 capsule, Rfl: 1   loratadine (CLARITIN) 10 MG tablet, Take 10 mg by mouth daily., Disp: , Rfl:    omeprazole (PRILOSEC) 20 MG capsule, Take 1 capsule (20 mg total) by mouth daily., Disp: 90 capsule, Rfl: 3   sildenafil (REVATIO) 20 MG tablet, TAKE 2 TABLETS BY MOUTH DAILY AS NEEDED 30 MINUTES PRIOR TO INTERCOURSE, Disp: 30 tablet, Rfl: 2   albuterol (VENTOLIN HFA) 108 (90 Base) MCG/ACT inhaler, Inhale 2 puffs into the lungs every 6 (six) hours as needed for wheezing or shortness of breath. (Patient not taking: Reported on 07/19/2022), Disp: 8 g, Rfl: 6   hydrocortisone-pramoxine (PROCTOFOAM-HC) rectal foam, Place 1 applicator rectally 3 (three) times daily. (Patient not taking: Reported on 07/19/2022), Disp: 10 g, Rfl: 1  EXAM:  VITALS per patient if applicable:     [id number]    3:57 PM 02/19/2022    8:39 AM 08/01/2021    8:21 AM  Vitals with BMI  Height '6\' 1"'$  6' 1.75" 6' 1.75"  Weight 245 lbs 10 oz 247 lbs 10 oz 242 lbs 13 oz  BMI 32.41 [id number]  Systolic [id number]  Diastolic 78 71 71  Pulse 64 56 55     GENERAL: alert, oriented, appears well and in no acute distress  HEENT: atraumatic, conjunttiva clear, no obvious abnormalities  on inspection of external nose and ears  NECK: normal movements of the head and neck  LUNGS: on inspection no signs of respiratory distress, breathing rate appears normal, no obvious gross SOB, gasping or wheezing  CV: no obvious cyanosis  MS: moves all visible extremities without noticeable abnormality  PSYCH/NEURO: pleasant and cooperative, no obvious depression or anxiety, speech and thought processing grossly intact  LABS: none today    Chemistry      Component Value Date/Time   NA 138 08/01/2021 0849   K 4.3 08/01/2021 0849   CL 102 08/01/2021 0849   CO2 29 08/01/2021 0849   BUN 15 08/01/2021  0849   CREATININE 0.94 08/01/2021 0849   CREATININE 0.77 02/03/2013 0816      Component Value Date/Time   CALCIUM 9.7 08/01/2021 0849   ALKPHOS 60 08/01/2021 0849   AST 37 08/01/2021 0849   ALT 68 (H) 08/01/2021 0849   BILITOT 0.5 08/01/2021 0849     ASSESSMENT AND PLAN:  Discussed the following assessment and plan:  #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  Refilled today.  #2 GAD, doing well long-term on fluoxetine 40 mg a day. He may choose to wean off this medicine in the near future because he feels like he is doing great and would like to avoid being on medication for this. We discussed the very gradual wean because he has had some return of anger and irritability when he has tried to discontinue this medication in the past.  3.  Allergic rhinitis. Using Afrin regularly in the last week. He states he usually goes to an urgent care in the spring and gets a steroid injection. In hopes of curbing the need for too much Afrin and to avoid urgent care visit will do trial of prednisone 20 mg, 2 tabs a day x 5 days.    I discussed the assessment and treatment plan with the patient. The patient was provided an opportunity to ask questions and all were answered. The patient agreed with the plan and demonstrated an understanding of the instructions.   F/u: 6 mo  Signed:  Crissie Sickles, MD           07/19/2022

## 2022-07-23 IMAGING — DX DG FINGER INDEX 2+V*L*
3 series · 3 of 3 positions shown · non-contrast
Comparison: None.

CLINICAL DATA: Laceration. Pt cut tip of finger with Hiroko clippers
today.

EXAM:
LEFT INDEX FINGER 2+V

[finger ap]
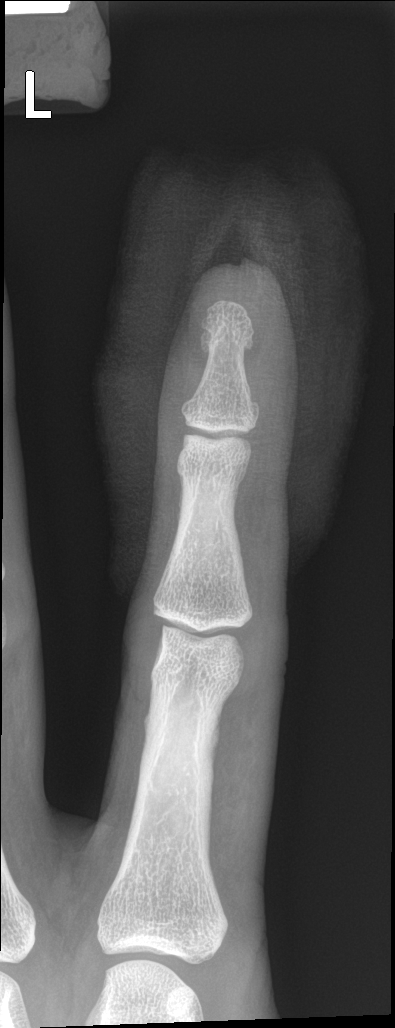

[finger obl]
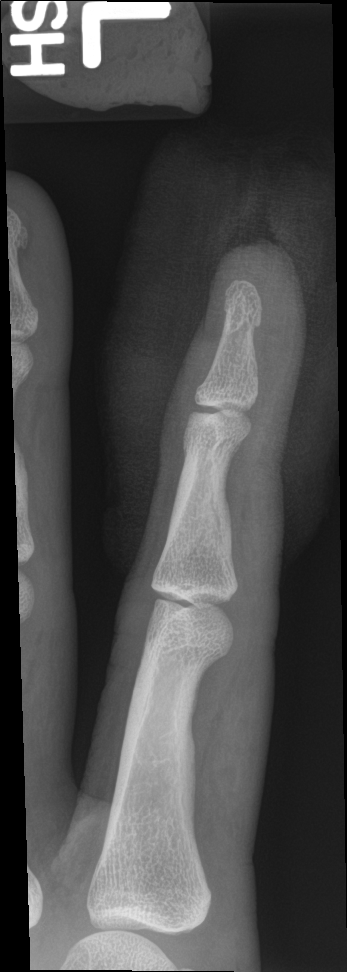

[finger lat]
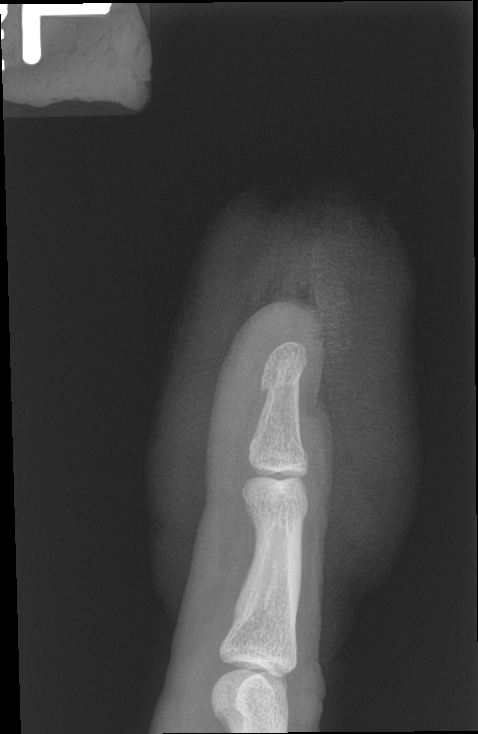

[3 of 3 positions shown; findings below may reference images not displayed]

FINDINGS: There is no evidence of fracture or dislocation. There is no
evidence of arthropathy or other focal bone abnormality. Soft tissue
defect of the volar aspect of the distal first digit. No retained
radiopaque foreign body.
IMPRESSION: 1. No acute displaced fracture or dislocation.
2. No retained radiopaque foreign body.

## 2022-08-26 ENCOUNTER — Other Ambulatory Visit: Payer: Self-pay | Admitting: Family Medicine

## 2022-09-09 ENCOUNTER — Other Ambulatory Visit: Payer: Self-pay

## 2022-09-09 MED ORDER — FLUOXETINE HCL 40 MG PO CAPS: 40.0000 mg | ORAL_CAPSULE | Freq: Every day | ORAL | 1 refills | Status: DC

## 2022-10-05 ENCOUNTER — Other Ambulatory Visit: Payer: Self-pay | Admitting: Family Medicine

## 2022-12-04 DIAGNOSIS — E291 Testicular hypofunction: Secondary | ICD-10-CM | POA: Diagnosis not present

## 2022-12-04 DIAGNOSIS — R748 Abnormal levels of other serum enzymes: Secondary | ICD-10-CM | POA: Diagnosis not present

## 2022-12-04 DIAGNOSIS — E559 Vitamin D deficiency, unspecified: Secondary | ICD-10-CM | POA: Diagnosis not present

## 2022-12-04 DIAGNOSIS — E538 Deficiency of other specified B group vitamins: Secondary | ICD-10-CM | POA: Diagnosis not present

## 2022-12-04 DIAGNOSIS — E349 Endocrine disorder, unspecified: Secondary | ICD-10-CM | POA: Diagnosis not present

## 2022-12-04 DIAGNOSIS — R5383 Other fatigue: Secondary | ICD-10-CM | POA: Diagnosis not present

## 2022-12-04 DIAGNOSIS — Z131 Encounter for screening for diabetes mellitus: Secondary | ICD-10-CM | POA: Diagnosis not present

## 2022-12-04 DIAGNOSIS — R790 Abnormal level of blood mineral: Secondary | ICD-10-CM | POA: Diagnosis not present

## 2022-12-19 DIAGNOSIS — E291 Testicular hypofunction: Secondary | ICD-10-CM | POA: Diagnosis not present

## 2022-12-19 DIAGNOSIS — R5383 Other fatigue: Secondary | ICD-10-CM | POA: Diagnosis not present

## 2022-12-19 DIAGNOSIS — R748 Abnormal levels of other serum enzymes: Secondary | ICD-10-CM | POA: Diagnosis not present

## 2023-01-03 DIAGNOSIS — J019 Acute sinusitis, unspecified: Secondary | ICD-10-CM | POA: Diagnosis not present

## 2023-01-03 DIAGNOSIS — H6691 Otitis media, unspecified, right ear: Secondary | ICD-10-CM | POA: Diagnosis not present

## 2023-01-03 DIAGNOSIS — H9201 Otalgia, right ear: Secondary | ICD-10-CM | POA: Diagnosis not present

## 2023-01-03 DIAGNOSIS — R0981 Nasal congestion: Secondary | ICD-10-CM | POA: Diagnosis not present

## 2023-02-25 ENCOUNTER — Other Ambulatory Visit: Payer: Self-pay | Admitting: Family Medicine

## 2023-02-25 NOTE — Telephone Encounter (Signed)
RF request for FLUoxetine (PROZAC) 40 MG capsule  LOV: 07/19/22 Next ov: advised to f/u 6 months, not  scheduled Last written: 09/09/22 (90,1)  RF request for omeprazole (PRILOSEC) 40 MG capsule  LOV: 07/19/22  Next ov: advised to f/u 6 months, not scheduled Last written: 10/07/22 (90,0)  Pt is currently due for appt

## 2023-02-26 MED ORDER — OMEPRAZOLE 40 MG PO CPDR: 40.0000 mg | DELAYED_RELEASE_CAPSULE | Freq: Every morning | ORAL | 0 refills | Status: DC

## 2023-02-26 MED ORDER — FLUOXETINE HCL 40 MG PO CAPS: 40.0000 mg | ORAL_CAPSULE | Freq: Every day | ORAL | 0 refills | Status: DC

## 2023-02-26 NOTE — Telephone Encounter (Signed)
30 day refill sent.

## 2023-02-26 NOTE — Telephone Encounter (Signed)
Patient wife - Nicole(DPR) called back regarding denied refills on his meds.  Wife aware he is currently due for appt. Due to patient work schedule, the soonest he can come in on 10/28. Patient is scheduled for 10/28 at 8am with Dr. Milinda Cave.  Can he get enough meds to get him thru till appt?   Please advise.

## 2023-02-26 NOTE — Addendum Note (Signed)
Addended by: Leonie Douglas on: 02/26/2023 03:19 PM   Modules accepted: Orders

## 2023-03-13 DIAGNOSIS — Z131 Encounter for screening for diabetes mellitus: Secondary | ICD-10-CM | POA: Diagnosis not present

## 2023-03-13 DIAGNOSIS — E538 Deficiency of other specified B group vitamins: Secondary | ICD-10-CM | POA: Diagnosis not present

## 2023-03-13 DIAGNOSIS — E559 Vitamin D deficiency, unspecified: Secondary | ICD-10-CM | POA: Diagnosis not present

## 2023-03-13 DIAGNOSIS — R5383 Other fatigue: Secondary | ICD-10-CM | POA: Diagnosis not present

## 2023-03-13 DIAGNOSIS — R748 Abnormal levels of other serum enzymes: Secondary | ICD-10-CM | POA: Diagnosis not present

## 2023-03-13 DIAGNOSIS — E291 Testicular hypofunction: Secondary | ICD-10-CM | POA: Diagnosis not present

## 2023-03-13 DIAGNOSIS — R790 Abnormal level of blood mineral: Secondary | ICD-10-CM | POA: Diagnosis not present

## 2023-03-13 DIAGNOSIS — E349 Endocrine disorder, unspecified: Secondary | ICD-10-CM | POA: Diagnosis not present

## 2023-03-17 ENCOUNTER — Other Ambulatory Visit: Payer: Self-pay | Admitting: Family Medicine

## 2023-03-17 ENCOUNTER — Telehealth: Payer: Self-pay

## 2023-03-17 ENCOUNTER — Other Ambulatory Visit (HOSPITAL_COMMUNITY): Payer: Self-pay

## 2023-03-17 ENCOUNTER — Ambulatory Visit: Payer: BC Managed Care – PPO | Admitting: Family Medicine

## 2023-03-17 ENCOUNTER — Encounter: Payer: Self-pay | Admitting: Family Medicine

## 2023-03-17 VITALS — BP 119/81 | HR 74 | Ht 74.0 in | Wt 230.4 lb

## 2023-03-17 DIAGNOSIS — F411 Generalized anxiety disorder: Secondary | ICD-10-CM | POA: Diagnosis not present

## 2023-03-17 DIAGNOSIS — K219 Gastro-esophageal reflux disease without esophagitis: Secondary | ICD-10-CM

## 2023-03-17 DIAGNOSIS — Z79899 Other long term (current) drug therapy: Secondary | ICD-10-CM

## 2023-03-17 MED ORDER — HYDROCORT-PRAMOXINE (PERIANAL) 1-1 % EX FOAM: 1.0000 | Freq: Three times a day (TID) | CUTANEOUS | 1 refills | Status: DC

## 2023-03-17 MED ORDER — FLUOXETINE HCL 20 MG PO CAPS: 20.0000 mg | ORAL_CAPSULE | Freq: Every day | ORAL | 1 refills | Status: DC

## 2023-03-17 MED ORDER — OMEPRAZOLE 20 MG PO CPDR: 20.0000 mg | DELAYED_RELEASE_CAPSULE | Freq: Every day | ORAL | 1 refills | Status: DC

## 2023-03-17 NOTE — Patient Instructions (Signed)
Alternate 40 mg Prozac daily with 20 mg daily.  When you run out of the 40 mg dose then continue 20 mg daily.  Alternate 40 mg omeprazole daily with 20 mg daily.  When you run out of 40 mg dose then continue 20 mg daily.

## 2023-03-17 NOTE — Telephone Encounter (Signed)
Anusol HC cream is the preferred medication at this time at a $13.92 copay. Previously changed to this due to copay for Proctofoam being too expensive. Please advise if you would like to continue PA for Proctofoam.

## 2023-03-17 NOTE — Telephone Encounter (Signed)
That's up to the patient

## 2023-03-17 NOTE — Telephone Encounter (Signed)
PA request has been Submitted. New Encounter created for follow up. For additional info see Pharmacy Prior Auth telephone encounter from 10/28.

## 2023-03-17 NOTE — Telephone Encounter (Signed)
*  Primary  Pharmacy Patient Advocate Encounter   Received notification from RX Request Messages that prior authorization for Proctofoam HC 1-1% foam  is required/requested.   Insurance verification completed.   The patient is insured through [facility] .   Per test claim: PA required; PA started via CoverMyMeds. KEY [id number] . Waiting for clinical questions to populate.

## 2023-03-17 NOTE — Telephone Encounter (Signed)
Please Advise

## 2023-03-17 NOTE — Progress Notes (Signed)
Office Note 03/17/2023  CC:  Chief Complaint  Patient presents with   Annual Exam    Pt is not fasting.     HPI:  Patient is a 37 y.o. male who is here for follow-up GAD and GERD. He feels much better over the last 6 months.  He quit drinking alcohol.  His joints do not hurt anymore. He goes to [facility] in [place].  He was placed on semaglutide for weight loss and has lost 15 to 20 pounds in is happy with this.  He says he got a full panel of labs last week.  He feels like things are going well from an anxiety standpoint.  He would like to wean down some on the Prozac.  Since his diet has improved so much his reflux is not bothering him nearly as much.  He would like to decrease Prilosec to 20 mg.   ROS as above, plus--> no fevers, no CP, no SOB, no wheezing, no cough, no dizziness, no HAs, no rashes, no melena/hematochezia.  No polyuria or polydipsia.  No myalgias or arthralgias.  No focal weakness, paresthesias, or tremors.  No acute vision or hearing abnormalities.  No dysuria or unusual/new urinary urgency or frequency.  No recent changes in lower legs. No n/v/d or abd pain.  No palpitations.     Past Medical History:  Diagnosis Date   ADD (attention deficit disorder) 10/22/2010   Adjustment disorder with mixed anxiety and depressed mood 04/2019   Dr. Mingo Amber concerned pt worse 05/2018->ween off prozac and start prn clonaz->small rx with close f/u).   Asthma    Environmental allergies    environmental--trees, grass, dust: also various metals make his hands break out (claritin 10mg  qd helps this)   Fatty liver 2012   noted on abd u/s.  AST and ALT mildly elevated 01/2013 and 12/2017.  Viral Hep NEG 12/2017.   GERD (gastroesophageal reflux disease)    Male pattern baldness    finasteride 1mg  qd   Obesity (BMI 30.0-34.9)     Past Surgical History:  Procedure Laterality Date   no surgery     VASECTOMY      Family History  Problem Relation  Age of Onset   Hypertension Father    Diabetes Maternal Grandmother    Heart disease Maternal Grandmother        MI, stents   Diabetes Paternal Grandfather    Cancer Neg Hx     Social History   Socioeconomic History   Marital status: Married    Spouse name: Not on file   Number of children: 0   Years of education: Not on file   Highest education level: Not on file  Occupational History   Not on file  Tobacco Use   Smoking status: Never   Smokeless tobacco: Never  Substance and Sexual Activity   Alcohol use: Yes    Alcohol/week: 1.0 standard drink of alcohol    Types: 1 Cans of beer per week    Comment: w-ends   Drug use: No   Sexual activity: Yes    Partners: Female  Other Topics Concern   Not on file  Social History Narrative      Has 1 daughter, Loura Pardon starting kindergarten as of Fall 2018.   Married.  Lives in [place].   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   No T/A/Ds.   Social  Determinants of Health   Financial Resource Strain: Not on file  Food Insecurity: Not on file  Transportation Needs: Not on file  Physical Activity: Not on file  Stress: Not on file  Social Connections: Not on file  Intimate Partner Violence: Not on file    Outpatient Medications Prior to Visit  Medication Sig Dispense Refill   finasteride (PROPECIA) 1 MG tablet Take 1 tablet (1 mg total) by mouth daily. 90 tablet 3   loratadine (CLARITIN) 10 MG tablet Take 10 mg by mouth daily.     FLUoxetine (PROZAC) 40 MG capsule Take 1 capsule (40 mg total) by mouth daily. 30 capsule 0   hydrocortisone-pramoxine (PROCTOFOAM-HC) rectal foam Place 1 applicator rectally 3 (three) times daily. 10 g 1   omeprazole (PRILOSEC) 20 MG capsule Take 1 capsule (20 mg total) by mouth daily. 90 capsule 3   albuterol (VENTOLIN HFA) 108 (90 Base) MCG/ACT inhaler Inhale 2 puffs into the lungs every 6 (six) hours as needed for  wheezing or shortness of breath. (Patient not taking: Reported on 07/19/2022) 8 g 6   Cyanocobalamin (B-12 PO) Take by mouth daily. (Patient not taking: Reported on 03/17/2023)     omeprazole (PRILOSEC) 40 MG capsule Take 1 capsule (40 mg total) by mouth every morning. (Patient not taking: Reported on 03/17/2023) 30 capsule 0   predniSONE (DELTASONE) 20 MG tablet 2 tabs p.o. daily x 5 days (Patient not taking: Reported on 03/17/2023) 10 tablet 0   sildenafil (REVATIO) 20 MG tablet TAKE 2 TABLETS BY MOUTH DAILY AS NEEDED 30 MINUTES PRIOR TO INTERCOURSE (Patient not taking: Reported on 03/17/2023) 30 tablet 2   No facility-administered medications prior to visit.    No Known Allergies   PE;    03/17/2023    8:05 AM 03/05/2022    3:57 PM 02/19/2022    8:39 AM  Vitals with BMI  Height 6\' 2"  6\' 1"  6' 1.75"  Weight 230 lbs 6 oz 245 lbs 10 oz 247 lbs 10 oz  BMI 29.57 32.41 32.01  Systolic 119 136 295  Diastolic 81 78 71  Pulse 74 64 56     Gen: Alert, well appearing.  Patient is oriented to person, place, time, and situation. AFFECT: pleasant, lucid thought and speech. No further exam today.  Pertinent labs:  Lab Results  Component Value Date   TSH 1.68 08/01/2021   Lab Results  Component Value Date   WBC 5.4 08/01/2021   HGB 15.1 08/01/2021   HCT 44.3 08/01/2021   MCV 85.7 08/01/2021   PLT 188.0 08/01/2021   Lab Results  Component Value Date   CREATININE 0.94 08/01/2021   BUN 15 08/01/2021   NA 138 08/01/2021   K 4.3 08/01/2021   CL 102 08/01/2021   CO2 29 08/01/2021   Lab Results  Component Value Date   ALT 68 (H) 08/01/2021   AST 37 08/01/2021   ALKPHOS 60 08/01/2021   BILITOT 0.5 08/01/2021   Lab Results  Component Value Date   CHOL 202 (H) 08/01/2021   Lab Results  Component Value Date   HDL 43.10 08/01/2021   Lab Results  Component Value Date   LDLCALC 120 (H) 08/01/2021   Lab Results  Component Value Date   TRIG 196.0 (H) 08/01/2021   Lab  Results  Component Value Date   CHOLHDL 5 08/01/2021   Lab Results  Component Value Date   HGBA1C 5.2 12/24/2017   ASSESSMENT AND PLAN:   #1 GAD.  Doing well.  Instructions:  Alternate 40 mg Prozac daily with 20 mg daily.  When you run out of the 40 mg dose then continue 20 mg daily.  #2 GERD, improving significantly. Instructions:  Alternate 40 mg omeprazole daily with 20 mg daily.  When you run out of 40 mg dose then continue 20 mg daily.  3 obesity, followed by [facility] and has been on semaglutide and doing well.  #4 preventative health care: Vaccines: Flu-> declined.   Labs: fasting HP labs were done last week at [facility]. Will asked him to send [place] a copy for his records.  An After Visit Summary was printed and given to the patient.  FOLLOW UP:  Return in about 6 months (around 09/15/2023) for routine chronic illness f/u.  Signed:  Santiago Bumpers, MD           03/17/2023

## 2023-03-21 DIAGNOSIS — R748 Abnormal levels of other serum enzymes: Secondary | ICD-10-CM | POA: Diagnosis not present

## 2023-03-21 DIAGNOSIS — E291 Testicular hypofunction: Secondary | ICD-10-CM | POA: Diagnosis not present

## 2023-03-21 DIAGNOSIS — R5383 Other fatigue: Secondary | ICD-10-CM | POA: Diagnosis not present

## 2023-03-29 ENCOUNTER — Other Ambulatory Visit: Payer: Self-pay | Admitting: Family Medicine

## 2023-05-08 DIAGNOSIS — R5383 Other fatigue: Secondary | ICD-10-CM | POA: Diagnosis not present

## 2023-05-08 DIAGNOSIS — E291 Testicular hypofunction: Secondary | ICD-10-CM | POA: Diagnosis not present

## 2023-05-08 DIAGNOSIS — R748 Abnormal levels of other serum enzymes: Secondary | ICD-10-CM | POA: Diagnosis not present

## 2023-06-17 DIAGNOSIS — R790 Abnormal level of blood mineral: Secondary | ICD-10-CM | POA: Diagnosis not present

## 2023-06-17 DIAGNOSIS — E349 Endocrine disorder, unspecified: Secondary | ICD-10-CM | POA: Diagnosis not present

## 2023-06-17 DIAGNOSIS — Z131 Encounter for screening for diabetes mellitus: Secondary | ICD-10-CM | POA: Diagnosis not present

## 2023-06-17 DIAGNOSIS — E559 Vitamin D deficiency, unspecified: Secondary | ICD-10-CM | POA: Diagnosis not present

## 2023-06-17 DIAGNOSIS — E538 Deficiency of other specified B group vitamins: Secondary | ICD-10-CM | POA: Diagnosis not present

## 2023-06-17 DIAGNOSIS — E291 Testicular hypofunction: Secondary | ICD-10-CM | POA: Diagnosis not present

## 2023-06-27 DIAGNOSIS — J019 Acute sinusitis, unspecified: Secondary | ICD-10-CM | POA: Diagnosis not present

## 2023-06-30 ENCOUNTER — Ambulatory Visit: Payer: Self-pay | Admitting: Family Medicine

## 2023-06-30 ENCOUNTER — Encounter: Payer: Self-pay | Admitting: Emergency Medicine

## 2023-06-30 ENCOUNTER — Ambulatory Visit
Admission: EM | Admit: 2023-06-30 | Discharge: 2023-06-30 | Disposition: A | Discharge status: Home / Self Care | Payer: BC Managed Care – PPO | Attending: Family Medicine | Admitting: Family Medicine

## 2023-06-30 DIAGNOSIS — K1121 Acute sialoadenitis: Secondary | ICD-10-CM

## 2023-06-30 MED ORDER — AMOXICILLIN-POT CLAVULANATE 875-125 MG PO TABS: 1.0000 | ORAL_TABLET | Freq: Two times a day (BID) | ORAL | 0 refills | Status: DC

## 2023-06-30 NOTE — Discharge Instructions (Signed)
 Increase your fluids Warm compresses to area for 10 minutes 3-4 times a day Take the antibiotic twice a day for a week Take ibuprofen as needed for pain See your doctor or an ENT if you fail to improve, or if swelling worsens Consider lemon drops or sour candy to help drain the parotid gland

## 2023-06-30 NOTE — Telephone Encounter (Signed)
 Noted.

## 2023-06-30 NOTE — ED Triage Notes (Signed)
 Patient c/o right sided facial swelling that he noticed today.  He did have a steroid injection on Friday night due to his allergies.  Sunday he noticed the right side of his face was sore and couldn't open his jaw.  This morning the right side of his is swollen.  Denies any trouble breathing.  Area is tender.  Patient switched Claritin to Allegra D and Flonase to Nasacort.

## 2023-06-30 NOTE — ED Provider Notes (Signed)
 [name]    CSN: [id number] Arrival date & time: 06/30/23  1352      History   Chief Complaint Chief Complaint  Patient presents with   Facial Swelling    HPI Gary Griffith is a 38 y.o. male.   Patient states that he has swelling on the right side of his face since yesterday.  Become more swollen and tender.  He has had no accident or injury.  States he has allergies and has been taking Allegra daily and Nasonex.  He did have a steroid shot given in his right buttock on Friday also for allergy symptoms.  He has had no fever or chills.  No sore throat.  No ear pain or hearing change    Past Medical History:  Diagnosis Date   ADD (attention deficit disorder) 10/22/2010   Adjustment disorder with mixed anxiety and depressed mood 04/2019   Dr. Ignacia Maki ->wife concerned pt worse 05/2018->ween off prozac  and start prn clonaz->small rx with close f/u).   Asthma    Environmental allergies    environmental--trees, grass, dust: also various metals make his hands break out (claritin 10mg  qd helps this)   Fatty liver 2012   noted on abd u/s.  AST and ALT mildly elevated 01/2013 and 12/2017.  Viral Hep NEG 12/2017.   GERD (gastroesophageal reflux disease)    Male pattern baldness    finasteride  1mg  qd   Obesity (BMI 30.0-34.9)     Patient Active Problem List   Diagnosis Date Noted   Obesity (BMI 30.0-34.9) 03/06/2022   Excessive daytime sleepiness 03/05/2022   DOE (dyspnea on exertion) 03/05/2022   Pain in left foot 08/28/2020   Acute upper respiratory infection 03/29/2015   Health maintenance examination 06/09/2014   Paronychia 03/03/2014   Eye lesion 11/30/2013   Poison ivy 10/07/2013   Hemorrhoid 10/07/2013   Folliculitis 10/07/2013   Acute upper respiratory infection 06/28/2013   Routine general medical examination at a health care facility 02/01/2013   Erectile dysfunction 02/01/2013   Fatty liver 11/04/2010   Elevated liver function tests 10/31/2010   ADD  (attention deficit disorder) 10/22/2010   Asthma 10/03/2010   GERD (gastroesophageal reflux disease) 10/03/2010   Allergic rhinitis 10/03/2010    Past Surgical History:  Procedure Laterality Date   no surgery     VASECTOMY         Home Medications    Prior to Admission medications   Medication Sig Start Date End Date Taking? Authorizing Provider  amoxicillin -clavulanate (AUGMENTIN ) 875-125 MG tablet Take 1 tablet by mouth every 12 (twelve) hours. 06/30/23  Yes Stephany Ehrich, MD  Fexofenadine-Pseudoephedrine (ALLEGRA-D PO) Take by mouth.   Yes [provider]  finasteride  (PROPECIA ) 1 MG tablet Take 1 tablet (1 mg total) by mouth daily. 07/19/22  Yes McGowen, Minetta Aly, MD  testosterone  cypionate (DEPOTESTOSTERONE CYPIONATE) 200 MG/ML injection Inject into the muscle. 06/05/23  Yes [provider]  Triamcinolone Acetonide (NASACORT AQ NA) Place into the nose.   Yes [provider]    Family History Family History  Problem Relation Age of Onset   Hypertension Father    Diabetes Maternal Grandmother    Heart disease Maternal Grandmother        MI, stents   Diabetes Paternal Grandfather    Cancer Neg Hx     Social History Social History   Tobacco Use   Smoking status: Never   Smokeless tobacco: Never  Vaping Use   Vaping status: Never  Used  Substance Use Topics   Alcohol use: Yes    Alcohol/week: 1.0 standard drink of alcohol    Types: 1 Cans of beer per week    Comment: w-ends   Drug use: No     Allergies   Patient has no known allergies.   Review of Systems Review of Systems See HPI  Physical Exam Triage Vital Signs ED Triage Vitals  Encounter Vitals Group     BP 06/30/23 1413 129/75     Systolic BP Percentile --      Diastolic BP Percentile --      Pulse Rate 06/30/23 1413 75     Resp 06/30/23 1413 18     Temp 06/30/23 1413 97.7 F (36.5 C)     Temp Source 06/30/23 1413 Oral     SpO2 06/30/23 1413 99 %     Weight  06/30/23 1416 225 lb (102.1 kg)     Height 06/30/23 1416 6\' 1"  (1.854 m)     Head Circumference --      Peak Flow --      Pain Score 06/30/23 1415 0     Pain Loc --      Pain Education --      Exclude from Growth Chart --    No data found.  Updated Vital Signs BP 129/75 (BP Location: Right Arm)   Pulse 75   Temp 97.7 F (36.5 C) (Oral)   Resp 18   Ht 6\' 1"  (1.854 m)   Wt 102.1 kg   SpO2 99%   BMI 29.69 kg/m      Physical Exam Constitutional:      General: He is not in acute distress.    Appearance: He is well-developed and normal weight.  HENT:     Head: Normocephalic and atraumatic.      Mouth/Throat:     Mouth: Mucous membranes are moist.     Pharynx: Oropharynx is clear.  Eyes:     Conjunctiva/sclera: Conjunctivae normal.     Pupils: Pupils are equal, round, and reactive to light.  Cardiovascular:     Rate and Rhythm: Normal rate.  Pulmonary:     Effort: Pulmonary effort is normal. No respiratory distress.  Abdominal:     General: There is no distension.     Palpations: Abdomen is soft.  Musculoskeletal:        General: Normal range of motion.     Cervical back: Normal range of motion.  Skin:    General: Skin is warm and dry.  Neurological:     Mental Status: He is alert.      UC Treatments / Results  Labs (all labs ordered are listed, but only abnormal results are displayed) Labs Reviewed - No data to display  EKG   Radiology No results found.  Procedures Procedures (including critical care time)  Medications Ordered in UC Medications - No data to display  Initial Impression / Assessment and Plan / UC Course  I have reviewed the triage vital signs and the nursing notes.  Pertinent labs & imaging results that were available during my care of the patient were reviewed by me and considered in my medical decision making (see chart for details).     Final Clinical Impressions(s) / UC Diagnoses   Final diagnoses:  Acute parotitis      Discharge Instructions      Increase your fluids Warm compresses to area for 10 minutes 3-4 times a day Take the antibiotic  twice a day for a week Take ibuprofen as needed for pain See your doctor or an ENT if you fail to improve, or if swelling worsens Consider lemon drops or sour candy to help drain the parotid gland   ED Prescriptions     Medication Sig Dispense Auth. Provider   amoxicillin -clavulanate (AUGMENTIN ) 875-125 MG tablet Take 1 tablet by mouth every 12 (twelve) hours. 14 tablet Stephany Ehrich, MD      PDMP not reviewed this encounter.   Stephany Ehrich, MD 06/30/23 [phone]

## 2023-06-30 NOTE — Telephone Encounter (Signed)
 Copied from CRM [id number]. Topic: Clinical - Red Word Triage >> Jun 30, 2023  8:22 AM Adonis Hoot wrote: Red Word that prompted transfer to Nurse Triage: swollen face, right side,painful,tenderness   Chief Complaint: Swelling/pain around right ear/mandible area Symptoms: swelling, pain Frequency: this morning Pertinent Negatives: Patient denies difficulty breathing, dental problems, itching, fevers Disposition: [] ED /[x] Urgent Care (no appt availability in office) / [] Appointment(In office/virtual)/ []  [facility]/ [] Home Care/ [] Refused Recommended Disposition /[] [facility]/ []  Follow-up with PCP Additional Notes: Patient's wife called for the patient who is an hour away at this time.  She states that he went to an urgent care on Friday for sinus pressure that she states he has every year and gets an injections of a steroid of some kind every year for.  She states that he was given a steroid injection at the urgent care and given Allegra to take.  Patient woke up this morning with swelling around his right ear, it  He was told to switch from Claritin to Allegra and he also was told to pick up Allegra D.  He took both Allegra and Allegra D at the same time, with no significant issue at that time but then he hasn't taken both since.   Patient woke up this morning with swelling around his right ear/mandible area.  Patient's wife denies that the patient has any difficulty breathing, dental problems, hives, itching, fevers, swelling of his tongue, and no rashes. He has also taken Ibuprofen for the pain in jaw.  Wife states that they don't believe it is a dental issue.  Patient's wife states that the patient would need an appointment this afternoon around 4pm due to where he is now.  He cancelled work this afternoon to possibly be seen today.  She is advised that the only afternoon appointment at his PCP office is at 2pm and she said that would not work.  She is advised that if they want to  be seen today the urgent care nearby could be an option.  She states that she will get him to go to the urgent care that they went to on Friday.  This way the records of his previous assessment on Friday with interventions would all be in the same location.  She states that is what they will do and she is also advised that if anything worsens to take him immediately to the emergency room.  Patient's wife verbalized understanding.   Reason for Disposition  Face swelling is painful to touch  Answer Assessment - Initial Assessment Questions 1. ONSET: "When did the swelling start?" (e.g., minutes, hours, days)     This morning 2. LOCATION: "What part of the face is swollen?"     Right side--near right ear 3. SEVERITY: "How swollen is it?"     widespread 4. ITCHING: "Is there any itching?" If Yes, ask: "How much?"   (Scale 1-10; mild, moderate or severe)     no 5. PAIN: "Is the swelling painful to touch?" If Yes, ask: "How painful is it?"   (Scale 1-10; mild, moderate or severe)   - NONE (0): no pain   - MILD (1-3): doesn't interfere with normal activities    - MODERATE (4-7): interferes with normal activities or awakens from sleep    - SEVERE (8-10): excruciating pain, unable to do any normal activities      mild 6. FEVER: "Do you have a fever?" If Yes, ask: "What is it, how was it  measured, and when did it start?"      no 7. CAUSE: "What do you think is causing the face swelling?"     Allergies/sinus issues 8. RECURRENT SYMPTOM: "Have you had face swelling before?" If Yes, ask: "When was the last time?" "What happened that time?"     no 9. OTHER SYMPTOMS: "Do you have any other symptoms?" (e.g., toothache, leg swelling)  Protocols used: Face Swelling-A-AH

## 2023-07-03 DIAGNOSIS — E291 Testicular hypofunction: Secondary | ICD-10-CM | POA: Diagnosis not present

## 2023-07-03 DIAGNOSIS — R748 Abnormal levels of other serum enzymes: Secondary | ICD-10-CM | POA: Diagnosis not present

## 2023-07-03 DIAGNOSIS — R5383 Other fatigue: Secondary | ICD-10-CM | POA: Diagnosis not present

## 2023-07-04 ENCOUNTER — Other Ambulatory Visit: Payer: Self-pay | Admitting: Family Medicine

## 2023-09-12 DIAGNOSIS — E291 Testicular hypofunction: Secondary | ICD-10-CM | POA: Diagnosis not present

## 2023-09-12 DIAGNOSIS — E538 Deficiency of other specified B group vitamins: Secondary | ICD-10-CM | POA: Diagnosis not present

## 2023-09-12 DIAGNOSIS — R790 Abnormal level of blood mineral: Secondary | ICD-10-CM | POA: Diagnosis not present

## 2023-09-12 DIAGNOSIS — Z131 Encounter for screening for diabetes mellitus: Secondary | ICD-10-CM | POA: Diagnosis not present

## 2023-09-12 DIAGNOSIS — E349 Endocrine disorder, unspecified: Secondary | ICD-10-CM | POA: Diagnosis not present

## 2023-09-28 ENCOUNTER — Other Ambulatory Visit: Payer: Self-pay | Admitting: Family Medicine

## 2023-09-30 DIAGNOSIS — R5383 Other fatigue: Secondary | ICD-10-CM | POA: Diagnosis not present

## 2023-09-30 DIAGNOSIS — R748 Abnormal levels of other serum enzymes: Secondary | ICD-10-CM | POA: Diagnosis not present

## 2023-09-30 DIAGNOSIS — E291 Testicular hypofunction: Secondary | ICD-10-CM | POA: Diagnosis not present

## 2023-10-20 ENCOUNTER — Telehealth: Payer: Self-pay

## 2023-10-20 DIAGNOSIS — K649 Unspecified hemorrhoids: Secondary | ICD-10-CM

## 2023-10-20 NOTE — Telephone Encounter (Signed)
 Okay, please order GI referral, diagnosis hemorrhoids.

## 2023-10-20 NOTE — Telephone Encounter (Signed)
 Copied from CRM [id number]. Topic: Referral - Request for Referral >> Oct 20, 2023 12:34 PM [place] J wrote: Did the patient discuss referral with their provider in the last year? Yes (If No - schedule appointment) (If Yes - send message)  Appointment offered? No  Type of order/referral and detailed reason for visit:  Patient is having hemorrhoids again and thinks it's time to see a gastroenterologist regarding this issue.   Preference of office, provider, location:  No preference.  If referral order, have you been seen by this specialty before? No (If Yes, this issue or another issue? When? Where?  Can we respond through MyChart? Yes

## 2023-10-20 NOTE — Addendum Note (Signed)
 Addended by: Terris Fickle D on: 10/20/2023 03:53 PM   Modules accepted: Orders

## 2023-11-04 DIAGNOSIS — L255 Unspecified contact dermatitis due to plants, except food: Secondary | ICD-10-CM | POA: Diagnosis not present

## 2023-11-10 ENCOUNTER — Other Ambulatory Visit: Payer: Self-pay

## 2023-11-10 MED ORDER — FLUOXETINE HCL 40 MG PO CAPS: 40.0000 mg | ORAL_CAPSULE | Freq: Every day | ORAL | 0 refills | Status: DC

## 2023-11-10 NOTE — Telephone Encounter (Signed)
 Fluoxetine  40 mg prescription sent today.

## 2023-11-10 NOTE — Telephone Encounter (Signed)
 Pt requesting Rx started taking again abo1-2 weeks ago from previous Rx. Pt is scheduled for F/U on 07/11

## 2023-11-10 NOTE — Telephone Encounter (Signed)
 Copied from CRM [id number]. Topic: Clinical - Medication Question >> Nov 10, 2023  9:03 AM Deaijah H wrote: Reason for CRM: Patients wife called in to have Dr. Candise send in a prescription of Fluoxitine 40 MG advised would need an appointment beforehand and stated she will call back to do so, but said he would need medication before appointment. Please call [phone] to confirm if prescription may be sent .

## 2023-11-11 ENCOUNTER — Encounter: Payer: Self-pay | Admitting: Gastroenterology

## 2023-11-28 ENCOUNTER — Encounter: Payer: Self-pay | Admitting: Family Medicine

## 2023-11-28 ENCOUNTER — Ambulatory Visit: Admitting: Family Medicine

## 2023-11-28 VITALS — BP 113/70 | HR 72 | Ht 74.0 in | Wt 207.4 lb

## 2023-11-28 DIAGNOSIS — F411 Generalized anxiety disorder: Secondary | ICD-10-CM

## 2023-11-28 MED ORDER — FLUOXETINE HCL 40 MG PO CAPS: 40.0000 mg | ORAL_CAPSULE | Freq: Every day | ORAL | 3 refills | Status: AC

## 2023-11-28 NOTE — Progress Notes (Signed)
 OFFICE VISIT  11/28/2023  CC:  Chief Complaint  Patient presents with   Medical Management of Chronic Issues    Patient is a 37 y.o. male who presents for 44-month follow-up GAD and GERD. A/P as of last visit: #1 GAD. Doing well.  Instructions:  Alternate 40 mg Prozac  daily with 20 mg daily.  When you run out of the 40 mg dose then continue 20 mg daily.   #2 GERD, improving significantly. Instructions:  Alternate 40 mg omeprazole  daily with 20 mg daily.  When you run out of 40 mg dose then continue 20 mg daily.   3 obesity, followed by [facility] and has been on semaglutide and doing well.   #4 preventative health care: Vaccines: Flu-> declined.   Labs: fasting HP labs were done last week at [facility]. Will asked him to send us  a copy for his records.  INTERIM HX: Cashtyn feels well. He spent some time off of the fluoxetine  but due to some persistent irritability and distractibility he was feeling more anxious and wife says he was harder to be around.  He did not feel depressed. He got back on the fluoxetine  at the 40 mg dose and since then has been doing well.  He was taking GLP-1 agonist through [facility] but says it has been a few months since he has been off of this medication. He is trying hard to maintain good dietary and exercise habits.  He no longer takes finasteride .  He takes a preparation he got online that has minoxidil 5 mg, finasteride  1.1 mg, and 1 mg of biotin. He says this is helping very well.  He takes an over-the-counter testosterone  supplement. He was on testosterone  injections but stopped this a couple of months ago.  He has not had any problems with GERD since making significant dietary improvements over the last year or so.  Past Medical History:  Diagnosis Date   ADD (attention deficit disorder) 10/22/2010   Adjustment disorder with mixed anxiety and depressed mood 04/2019   Dr.  Loetta ->wife concerned pt worse 05/2018->ween off prozac  and start prn clonaz->small rx with close f/u).   Asthma    Environmental allergies    environmental--trees, grass, dust: also various metals make his hands break out (claritin 10mg  qd helps this)   Fatty liver 2012   noted on abd u/s.  AST and ALT mildly elevated 01/2013 and 12/2017.  Viral Hep NEG 12/2017.   GERD (gastroesophageal reflux disease)    Male pattern baldness    finasteride  1mg  qd   Obesity (BMI 30.0-34.9)     Past Surgical History:  Procedure Laterality Date   no surgery     VASECTOMY      Outpatient Medications Prior to Visit  Medication Sig Dispense Refill   Collagen Hydrolysate POWD by Does not apply route daily.     Creatinine POWD by Does not apply route daily.     Fexofenadine-Pseudoephedrine (ALLEGRA-D PO) Take by mouth.     Multiple Vitamin (MULTIVITAMIN ADULT PO) Take by mouth daily.     OVER THE COUNTER MEDICATION LockLab pill daily for hair loss containing Minoxidil 5mg , Finasteride  1.1mg  and Biotin 1mg      OVER THE COUNTER MEDICATION Testosterone  Booster; 5 pills by mouth daily     finasteride  (PROPECIA ) 1 MG tablet Take 1 tablet (1 mg total) by mouth daily. OFFICE VISIT NEEDED FOR FURTHER REFILLS 30 tablet 0   amoxicillin -clavulanate (AUGMENTIN ) 875-125 MG tablet Take 1 tablet  by mouth every 12 (twelve) hours. 14 tablet 0   FLUoxetine  (PROZAC ) 40 MG capsule Take 1 capsule (40 mg total) by mouth daily. 30 capsule 0   testosterone  cypionate (DEPOTESTOSTERONE CYPIONATE) 200 MG/ML injection Inject into the muscle. (Patient not taking: Reported on 11/28/2023)     Triamcinolone Acetonide (NASACORT AQ NA) Place into the nose. (Patient not taking: Reported on 11/28/2023)     No facility-administered medications prior to visit.    No Known Allergies  Review of Systems As per HPI  PE:    11/28/2023    2:51 PM 06/30/2023    2:16 PM 06/30/2023    2:13 PM  Vitals with BMI  Height 6' 2 6' 1   Weight  207 lbs 6 oz 225 lbs   BMI 26.62 29.69   Systolic 113  129  Diastolic 70  75  Pulse 72  75   Physical Exam  Gen: Alert, well appearing.  Patient is oriented to person, place, time, and situation. AFFECT: pleasant, lucid thought and speech. No further exam today  LABS:  Last CBC Lab Results  Component Value Date   WBC 5.4 08/01/2021   HGB 15.1 08/01/2021   HCT 44.3 08/01/2021   MCV 85.7 08/01/2021   MCH 30.0 12/18/2019   RDW 13.1 08/01/2021   PLT 188.0 08/01/2021   Last metabolic panel Lab Results  Component Value Date   GLUCOSE 99 08/01/2021   NA 138 08/01/2021   K 4.3 08/01/2021   CL 102 08/01/2021   CO2 29 08/01/2021   BUN 15 08/01/2021   CREATININE 0.94 08/01/2021   GFR 104.94 08/01/2021   CALCIUM 9.7 08/01/2021   PROT 6.5 08/01/2021   ALBUMIN 4.7 08/01/2021   BILITOT 0.5 08/01/2021   ALKPHOS 60 08/01/2021   AST 37 08/01/2021   ALT 68 (H) 08/01/2021   ANIONGAP 13 12/18/2019   Last lipids Lab Results  Component Value Date   CHOL 202 (H) 08/01/2021   HDL 43.10 08/01/2021   LDLCALC 120 (H) 08/01/2021   LDLDIRECT 89.0 06/09/2014   TRIG 196.0 (H) 08/01/2021   CHOLHDL 5 08/01/2021   Last hemoglobin A1c Lab Results  Component Value Date   HGBA1C 5.2 12/24/2017   Last thyroid  functions Lab Results  Component Value Date   TSH 1.68 08/01/2021   IMPRESSION AND PLAN:  GAD, doing well long-term on fluoxetine  40 mg a day. Refilled today.  An After Visit Summary was printed and given to the patient.  FOLLOW UP: Return in about 1 year (around 11/27/2024) for annual CPE (fasting).  Signed:  Gerlene Hockey, MD           11/28/2023

## 2023-12-16 DIAGNOSIS — K219 Gastro-esophageal reflux disease without esophagitis: Secondary | ICD-10-CM | POA: Diagnosis not present

## 2023-12-16 DIAGNOSIS — Z131 Encounter for screening for diabetes mellitus: Secondary | ICD-10-CM | POA: Diagnosis not present

## 2023-12-16 DIAGNOSIS — R790 Abnormal level of blood mineral: Secondary | ICD-10-CM | POA: Diagnosis not present

## 2023-12-16 DIAGNOSIS — E559 Vitamin D deficiency, unspecified: Secondary | ICD-10-CM | POA: Diagnosis not present

## 2023-12-16 DIAGNOSIS — R748 Abnormal levels of other serum enzymes: Secondary | ICD-10-CM | POA: Diagnosis not present

## 2023-12-16 DIAGNOSIS — R5383 Other fatigue: Secondary | ICD-10-CM | POA: Diagnosis not present

## 2023-12-17 ENCOUNTER — Other Ambulatory Visit: Payer: Self-pay | Admitting: Family Medicine

## 2023-12-30 DIAGNOSIS — R5383 Other fatigue: Secondary | ICD-10-CM | POA: Diagnosis not present

## 2023-12-30 DIAGNOSIS — E291 Testicular hypofunction: Secondary | ICD-10-CM | POA: Diagnosis not present

## 2023-12-30 DIAGNOSIS — R748 Abnormal levels of other serum enzymes: Secondary | ICD-10-CM | POA: Diagnosis not present

## 2024-01-05 ENCOUNTER — Ambulatory Visit: Admitting: Gastroenterology

## 2024-03-05 DIAGNOSIS — R748 Abnormal levels of other serum enzymes: Secondary | ICD-10-CM | POA: Diagnosis not present

## 2024-03-05 DIAGNOSIS — R790 Abnormal level of blood mineral: Secondary | ICD-10-CM | POA: Diagnosis not present

## 2024-03-05 DIAGNOSIS — R5383 Other fatigue: Secondary | ICD-10-CM | POA: Diagnosis not present

## 2024-03-05 DIAGNOSIS — Z131 Encounter for screening for diabetes mellitus: Secondary | ICD-10-CM | POA: Diagnosis not present

## 2024-03-30 DIAGNOSIS — R5383 Other fatigue: Secondary | ICD-10-CM | POA: Diagnosis not present

## 2024-03-30 DIAGNOSIS — E291 Testicular hypofunction: Secondary | ICD-10-CM | POA: Diagnosis not present

## 2024-03-30 DIAGNOSIS — R748 Abnormal levels of other serum enzymes: Secondary | ICD-10-CM | POA: Diagnosis not present
